# Patient Record
Sex: Female | Born: 1975
Health system: Southern US, Community
[De-identification: ages and names within clinical notes are randomized; demographics above are authoritative.]

## PROBLEM LIST (undated history)

## (undated) DIAGNOSIS — N898 Other specified noninflammatory disorders of vagina: Secondary | ICD-10-CM

## (undated) DIAGNOSIS — D219 Benign neoplasm of connective and other soft tissue, unspecified: Secondary | ICD-10-CM

## (undated) DIAGNOSIS — R319 Hematuria, unspecified: Secondary | ICD-10-CM

## (undated) DIAGNOSIS — B379 Candidiasis, unspecified: Secondary | ICD-10-CM

## (undated) DIAGNOSIS — D649 Anemia, unspecified: Secondary | ICD-10-CM

## (undated) DIAGNOSIS — Z309 Encounter for contraceptive management, unspecified: Secondary | ICD-10-CM

## (undated) DIAGNOSIS — Z8619 Personal history of other infectious and parasitic diseases: Secondary | ICD-10-CM

## (undated) DIAGNOSIS — A630 Anogenital (venereal) warts: Secondary | ICD-10-CM

## (undated) HISTORY — DX: Benign neoplasm of connective and other soft tissue, unspecified: D21.9

## (undated) HISTORY — PX: TOOTH EXTRACTION: SUR596

## (undated) HISTORY — DX: Encounter for contraceptive management, unspecified: Z30.9

## (undated) HISTORY — DX: Hematuria, unspecified: R31.9

## (undated) HISTORY — DX: Anemia, unspecified: D64.9

## (undated) HISTORY — DX: Anogenital (venereal) warts: A63.0

## (undated) HISTORY — DX: Candidiasis, unspecified: B37.9

## (undated) HISTORY — DX: Other specified noninflammatory disorders of vagina: N89.8

## (undated) HISTORY — DX: Personal history of other infectious and parasitic diseases: Z86.19

## (undated) HISTORY — PX: ABDOMINAL HYSTERECTOMY: SHX81

---

## 2009-12-26 ENCOUNTER — Other Ambulatory Visit: Admission: RE | Admit: 2009-12-26 | Discharge: 2009-12-26 | Payer: Self-pay | Admitting: Obstetrics & Gynecology

## 2011-01-03 ENCOUNTER — Other Ambulatory Visit (HOSPITAL_COMMUNITY)
Admission: RE | Admit: 2011-01-03 | Discharge: 2011-01-03 | Disposition: A | Discharge status: Home / Self Care | Payer: BC Managed Care – PPO | Source: Ambulatory Visit | Attending: Obstetrics and Gynecology | Admitting: Obstetrics and Gynecology

## 2011-01-03 DIAGNOSIS — Z01419 Encounter for gynecological examination (general) (routine) without abnormal findings: Secondary | ICD-10-CM | POA: Insufficient documentation

## 2012-01-08 ENCOUNTER — Other Ambulatory Visit (HOSPITAL_COMMUNITY)
Admission: RE | Admit: 2012-01-08 | Discharge: 2012-01-08 | Disposition: A | Discharge status: Home / Self Care | Payer: BC Managed Care – PPO | Source: Ambulatory Visit | Attending: Obstetrics and Gynecology | Admitting: Obstetrics and Gynecology

## 2012-01-08 ENCOUNTER — Other Ambulatory Visit: Payer: Self-pay | Admitting: Obstetrics & Gynecology

## 2012-01-08 DIAGNOSIS — R87810 Cervical high risk human papillomavirus (HPV) DNA test positive: Secondary | ICD-10-CM | POA: Insufficient documentation

## 2012-01-08 DIAGNOSIS — Z01419 Encounter for gynecological examination (general) (routine) without abnormal findings: Secondary | ICD-10-CM | POA: Insufficient documentation

## 2012-01-08 DIAGNOSIS — Z1151 Encounter for screening for human papillomavirus (HPV): Secondary | ICD-10-CM | POA: Insufficient documentation

## 2013-01-05 ENCOUNTER — Other Ambulatory Visit: Payer: Self-pay | Admitting: Adult Health

## 2013-01-11 ENCOUNTER — Other Ambulatory Visit: Payer: Self-pay | Admitting: Adult Health

## 2013-01-28 ENCOUNTER — Encounter (INDEPENDENT_AMBULATORY_CARE_PROVIDER_SITE_OTHER): Payer: Self-pay

## 2013-01-28 ENCOUNTER — Encounter: Payer: Self-pay | Admitting: Adult Health

## 2013-01-28 ENCOUNTER — Ambulatory Visit (INDEPENDENT_AMBULATORY_CARE_PROVIDER_SITE_OTHER): Payer: BC Managed Care – PPO | Admitting: Adult Health

## 2013-01-28 ENCOUNTER — Other Ambulatory Visit (HOSPITAL_COMMUNITY)
Admission: RE | Admit: 2013-01-28 | Discharge: 2013-01-28 | Disposition: A | Discharge status: Home / Self Care | Payer: BC Managed Care – PPO | Source: Ambulatory Visit | Attending: Adult Health | Admitting: Adult Health

## 2013-01-28 VITALS — BP 120/82 | HR 72 | Ht 62.0 in | Wt 133.0 lb

## 2013-01-28 DIAGNOSIS — Z8619 Personal history of other infectious and parasitic diseases: Secondary | ICD-10-CM

## 2013-01-28 DIAGNOSIS — R87810 Cervical high risk human papillomavirus (HPV) DNA test positive: Secondary | ICD-10-CM | POA: Insufficient documentation

## 2013-01-28 DIAGNOSIS — Z01419 Encounter for gynecological examination (general) (routine) without abnormal findings: Secondary | ICD-10-CM

## 2013-01-28 DIAGNOSIS — Z1151 Encounter for screening for human papillomavirus (HPV): Secondary | ICD-10-CM | POA: Insufficient documentation

## 2013-01-28 DIAGNOSIS — Z309 Encounter for contraceptive management, unspecified: Secondary | ICD-10-CM

## 2013-01-28 HISTORY — DX: Personal history of other infectious and parasitic diseases: Z86.19

## 2013-01-28 HISTORY — DX: Encounter for contraceptive management, unspecified: Z30.9

## 2013-01-28 MED ORDER — NORGESTIMATE-ETH ESTRADIOL 0.25-35 MG-MCG PO TABS: ORAL_TABLET | ORAL | Status: DC

## 2013-01-28 NOTE — Patient Instructions (Signed)
Physical in 1 year Mammogram at 40  Continue pills  Follow up prn

## 2013-01-28 NOTE — Progress Notes (Signed)
Patient ID: Nichole Vargas, female   DOB: 15-Nov-1975, 37 y.o.   MRN: [id number] History of Present Illness: Nichole Vargas is a 37 year old black female in for a pap and physical.She had a normal pap last year but had +HPV.   Current Medications, Allergies, Past Medical History, Past Surgical History, Family History and Social History were reviewed in [employer] record.     Review of Systems: Patient denies any headaches, blurred vision, shortness of breath, chest pain, abdominal pain, problems with bowel movements, urination, or intercourse. No joint pain or mood changes.    Physical Exam:BP 120/82  Pulse 72  Ht 5\' 2"  (1.575 m)  Wt 133 lb (60.328 kg)  BMI 24.32 kg/m2  LMP 01/13/2013 General:  Well developed, well nourished, no acute distress Skin:  Warm and dry Neck:  Midline trachea, normal thyroid Lungs; Clear to auscultation bilaterally Breast:  No dominant palpable mass, retraction, or nipple discharge Cardiovascular: Regular rate and rhythm Abdomen:  Soft, non tender, no hepatosplenomegaly Pelvic:  External genitalia is normal in appearance.  The vagina is normal in appearance, has some spotting, pap performed with HPV.The cervix is bulbous.  Uterus is felt to be normal size, shape, and contour.  No  adnexal masses or tenderness noted. Extremities:  No swelling or varicosities noted Psych:  No mood changes, alert and cooperative,seems happy, still works at [employer]   Impression: Yearly gyn exam History of HPV Contraceptive mangement    Plan: Refilled sprintec x 1 year Physical in 1 year Mammogram at 40 Call prn problems

## 2013-02-01 ENCOUNTER — Telehealth: Payer: Self-pay | Admitting: Adult Health

## 2013-02-01 NOTE — Telephone Encounter (Signed)
Pt aware of pap normal but +HPV will repeat next year

## 2013-12-20 ENCOUNTER — Encounter: Payer: Self-pay | Admitting: Adult Health

## 2014-01-10 ENCOUNTER — Other Ambulatory Visit: Payer: Self-pay | Admitting: Adult Health

## 2014-02-01 ENCOUNTER — Other Ambulatory Visit: Payer: BC Managed Care – PPO | Admitting: Adult Health

## 2014-02-04 ENCOUNTER — Other Ambulatory Visit (HOSPITAL_COMMUNITY)
Admission: RE | Admit: 2014-02-04 | Discharge: 2014-02-04 | Disposition: A | Discharge status: Home / Self Care | Payer: BC Managed Care – PPO | Source: Ambulatory Visit | Attending: Adult Health | Admitting: Adult Health

## 2014-02-04 ENCOUNTER — Ambulatory Visit (INDEPENDENT_AMBULATORY_CARE_PROVIDER_SITE_OTHER): Payer: BC Managed Care – PPO | Admitting: Adult Health

## 2014-02-04 ENCOUNTER — Encounter: Payer: Self-pay | Admitting: Adult Health

## 2014-02-04 VITALS — BP 138/50 | HR 84 | Ht 63.0 in | Wt 129.0 lb

## 2014-02-04 DIAGNOSIS — Z01419 Encounter for gynecological examination (general) (routine) without abnormal findings: Secondary | ICD-10-CM | POA: Diagnosis not present

## 2014-02-04 DIAGNOSIS — Z3041 Encounter for surveillance of contraceptive pills: Secondary | ICD-10-CM

## 2014-02-04 DIAGNOSIS — Z1151 Encounter for screening for human papillomavirus (HPV): Secondary | ICD-10-CM | POA: Insufficient documentation

## 2014-02-04 DIAGNOSIS — Z8619 Personal history of other infectious and parasitic diseases: Secondary | ICD-10-CM

## 2014-02-04 MED ORDER — NORGESTIMATE-ETH ESTRADIOL 0.25-35 MG-MCG PO TABS: 1.0000 | ORAL_TABLET | Freq: Every day | ORAL | Status: DC

## 2014-02-04 NOTE — Progress Notes (Signed)
Patient ID: Nichole Vargas, female   DOB: 25-Dec-1975, 38 y.o.   MRN: [id number] History of Present Illness: Nichole Vargas is a 38 year old black female in for pap and physical.She had normal pap with +HPV 01/28/13.She is happy with her OCs.No complaints today.   Current Medications, Allergies, Past Medical History, Past Surgical History, Family History and Social History were reviewed in [employer] record.     Review of Systems: Patient denies any headaches, blurred vision, shortness of breath, chest pain, abdominal pain, problems with bowel movements, urination, or intercourse.  No joint pain or mood swings.   Physical Exam:BP 138/50 mmHg  Pulse 84  Ht 5\' 3"  (1.6 m)  Wt 129 lb (58.514 kg)  BMI 22.86 kg/m2  LMP 02/04/2014 General:  Well developed, well nourished, no acute distress Skin:  Warm and dry Neck:  Midline trachea, normal thyroid Lungs; Clear to auscultation bilaterally Breast:  No dominant palpable mass, retraction, or nipple discharge Cardiovascular: Regular rate and rhythm Abdomen:  Soft, non tender, no hepatosplenomegaly Pelvic:  External genitalia is normal in appearance.  The vagina is normal in appearance, with period like blood. The cervix is bulbous and smooth, pap with HPV performed. Uterus is felt to be normal size, shape, and contour.  No adnexal masses or tenderness noted. Extremities:  No swelling or varicosities noted Psych:  No mood changes,alert and cooperative,seems happy She declines flu shot and labs  Impression: Well woman gyn exam with pap Contraceptive management History of +HPV    Plan: Physical in 1 year Mammogram at 40 Refilled sprintec x 1 year

## 2014-02-04 NOTE — Patient Instructions (Signed)
Physical in  1 year Mammogram at 40 

## 2014-02-07 LAB — CYTOLOGY - PAP

## 2014-12-13 ENCOUNTER — Other Ambulatory Visit: Payer: Self-pay | Admitting: Adult Health

## 2015-01-04 ENCOUNTER — Other Ambulatory Visit: Payer: Self-pay | Admitting: Adult Health

## 2015-02-23 ENCOUNTER — Ambulatory Visit (INDEPENDENT_AMBULATORY_CARE_PROVIDER_SITE_OTHER): Payer: BLUE CROSS/BLUE SHIELD | Admitting: Adult Health

## 2015-02-23 ENCOUNTER — Encounter: Payer: Self-pay | Admitting: Adult Health

## 2015-02-23 VITALS — BP 140/80 | HR 96 | Ht 63.0 in | Wt 124.0 lb

## 2015-02-23 DIAGNOSIS — Z01419 Encounter for gynecological examination (general) (routine) without abnormal findings: Secondary | ICD-10-CM

## 2015-02-23 DIAGNOSIS — B379 Candidiasis, unspecified: Secondary | ICD-10-CM | POA: Diagnosis not present

## 2015-02-23 DIAGNOSIS — N898 Other specified noninflammatory disorders of vagina: Secondary | ICD-10-CM

## 2015-02-23 DIAGNOSIS — Z3041 Encounter for surveillance of contraceptive pills: Secondary | ICD-10-CM

## 2015-02-23 HISTORY — DX: Candidiasis, unspecified: B37.9

## 2015-02-23 HISTORY — DX: Other specified noninflammatory disorders of vagina: N89.8

## 2015-02-23 LAB — POCT WET PREP (WET MOUNT): CLUE CELLS WET PREP WHIFF POC: NEGATIVE

## 2015-02-23 MED ORDER — TERCONAZOLE 0.4 % VA CREA: 1.0000 | TOPICAL_CREAM | Freq: Every day | VAGINAL | Status: DC

## 2015-02-23 MED ORDER — NORGESTIMATE-ETH ESTRADIOL 0.25-35 MG-MCG PO TABS: 1.0000 | ORAL_TABLET | Freq: Every day | ORAL | Status: DC

## 2015-02-23 NOTE — Progress Notes (Signed)
Patient ID: Nichole Vargas, female   DOB: Apr 24, 1975, 40 y.o.   MRN: [id number] History of Present Illness:  Nichole Vargas is a 40 year old black female in for a well woman gyn exam, she had a normal pap with negative HPV 02/04/14.She complains of vaginal itching,took amoxicillin for tooth last month, and had extraction, she also has been eating a lot of ice.She is happy with her pills.  Current Medications, Allergies, Past Medical History, Past Surgical History, Family History and Social History were reviewed in [employer] record.     Review of Systems: Patient denies any headaches, hearing loss, fatigue, blurred vision, shortness of breath, chest pain, abdominal pain, problems with bowel movements, urination, or intercourse. No joint pain or mood swings.See HPI for positives.    Physical Exam:BP 140/80 mmHg  Pulse 96  Ht 5\' 3"  (1.6 m)  Wt 124 lb (56.246 kg)  BMI 21.97 kg/m2  LMP 02/03/2015 General:  Well developed, well nourished, no acute distress Skin:  Warm and dry Neck:  Midline trachea, normal thyroid, good ROM, no lymphadenopathy Lungs; Clear to auscultation bilaterally Breast:  No dominant palpable mass, retraction, or nipple discharge Cardiovascular: Regular rate and rhythm Abdomen:  Soft, non tender, no hepatosplenomegaly Pelvic:  External genitalia is normal in appearance, no lesions.  The vagina is normal in appearance,but has a white clumpy discharge, no odor. Urethra has no lesions or masses. The cervix is bulbous.  Uterus is felt to be normal size, shape, and contour.  No adnexal masses or tenderness noted.Bladder is non tender, no masses felt.Wet prep was + for yeast buds Extremities/musculoskeletal:  No swelling or varicosities noted, no clubbing or cyanosis Psych:  No mood changes, alert and cooperative,seems happy Will check labs today.  Impression: Well woman gyn exam no pap Vaginal itch  Yeast Contraceptive management    Plan: Check  CBC,CMP,TSH and lipids Physical in 1 year, pap 2018 Mammogram at 40  Rx terazol 7 cream use in vagina x 1 wee Review handout on yeast

## 2015-02-23 NOTE — Patient Instructions (Signed)
Mammogram at 40 Physical in 1 year Use terazol  Will talk when labs back Monilial Vaginitis Vaginitis in a soreness, swelling and redness (inflammation) of the vagina and vulva. Monilial vaginitis is not a sexually transmitted infection. CAUSES  Yeast vaginitis is caused by yeast (candida) that is normally found in your vagina. With a yeast infection, the candida has overgrown in number to a point that upsets the chemical balance. SYMPTOMS   White, thick vaginal discharge.  Swelling, itching, redness and irritation of the vagina and possibly the lips of the vagina (vulva).  Burning or painful urination.  Painful intercourse. DIAGNOSIS  Things that may contribute to monilial vaginitis are:  Postmenopausal and virginal states.  Pregnancy.  Infections.  Being tired, sick or stressed, especially if you had monilial vaginitis in the past.  Diabetes. Good control will help lower the chance.  Birth control pills.  Tight fitting garments.  Using bubble bath, feminine sprays, douches or deodorant tampons.  Taking certain medications that kill germs (antibiotics).  Sporadic recurrence can occur if you become ill. TREATMENT  Your caregiver will give you medication.  There are several kinds of anti monilial vaginal creams and suppositories specific for monilial vaginitis. For recurrent yeast infections, use a suppository or cream in the vagina 2 times a week, or as directed.  Anti-monilial or steroid cream for the itching or irritation of the vulva may also be used. Get your caregiver's permission.  Painting the vagina with methylene blue solution may help if the monilial cream does not work.  Eating yogurt may help prevent monilial vaginitis. HOME CARE INSTRUCTIONS   Finish all medication as prescribed.  Do not have sex until treatment is completed or after your caregiver tells you it is okay.  Take warm sitz baths.  Do not douche.  Do not use tampons, especially  scented ones.  Wear cotton underwear.  Avoid tight pants and panty hose.  Tell your sexual partner that you have a yeast infection. They should go to their caregiver if they have symptoms such as mild rash or itching.  Your sexual partner should be treated as well if your infection is difficult to eliminate.  Practice safer sex. Use condoms.  Some vaginal medications cause latex condoms to fail. Vaginal medications that harm condoms are:  Cleocin cream.  Butoconazole (Femstat).  Terconazole (Terazol) vaginal suppository.  Miconazole (Monistat) (may be purchased over the counter). SEEK MEDICAL CARE IF:   You have a temperature by mouth above 102 F (38.9 C).  The infection is getting worse after 2 days of treatment.  The infection is not getting better after 3 days of treatment.  You develop blisters in or around your vagina.  You develop vaginal bleeding, and it is not your menstrual period.  You have pain when you urinate.  You develop intestinal problems.  You have pain with sexual intercourse.   This information is not intended to replace advice given to you by your health care provider. Make sure you discuss any questions you have with your health care provider.   Document Released: 11/14/2004 Document Revised: 04/29/2011 Document Reviewed: 08/08/2014 Elsevier Interactive Patient Education [employer].

## 2015-02-24 ENCOUNTER — Telehealth: Payer: Self-pay | Admitting: Adult Health

## 2015-02-24 LAB — COMPREHENSIVE METABOLIC PANEL
ALBUMIN: 4 g/dL (ref 3.5–5.5)
ALK PHOS: 55 IU/L (ref 39–117)
ALT: 9 IU/L (ref 0–32)
AST: 13 IU/L (ref 0–40)
Albumin/Globulin Ratio: 1.2 (ref 1.1–2.5)
BILIRUBIN TOTAL: 0.2 mg/dL (ref 0.0–1.2)
BUN / CREAT RATIO: 7 — AB (ref 8–20)
BUN: 7 mg/dL (ref 6–20)
CHLORIDE: 103 mmol/L (ref 96–106)
CO2: 20 mmol/L (ref 18–29)
Calcium: 9.1 mg/dL (ref 8.7–10.2)
Creatinine, Ser: 0.97 mg/dL (ref 0.57–1.00)
GFR calc Af Amer: 85 mL/min/{1.73_m2} (ref >59)
GFR calc non Af Amer: 74 mL/min/{1.73_m2} (ref >59)
GLUCOSE: 93 mg/dL (ref 65–99)
Globulin, Total: 3.4 g/dL (ref 1.5–4.5)
Potassium: 3.9 mmol/L (ref 3.5–5.2)
SODIUM: 142 mmol/L (ref 134–144)
Total Protein: 7.4 g/dL (ref 6.0–8.5)

## 2015-02-24 LAB — CBC
HEMOGLOBIN: 7 g/dL — AB (ref 11.1–15.9)
Hematocrit: 25.1 % — ABNORMAL LOW (ref 34.0–46.6)
MCH: 16.3 pg — AB (ref 26.6–33.0)
MCHC: 27.9 g/dL — AB (ref 31.5–35.7)
MCV: 58 fL — ABNORMAL LOW (ref 79–97)
Platelets: 403 10*3/uL — ABNORMAL HIGH (ref 150–379)
RBC: 4.3 x10E6/uL (ref 3.77–5.28)
RDW: 18.9 % — AB (ref 12.3–15.4)
WBC: 6.6 10*3/uL (ref 3.4–10.8)

## 2015-02-24 LAB — LIPID PANEL
Chol/HDL Ratio: 3.3 ratio units (ref 0.0–4.4)
Cholesterol, Total: 169 mg/dL (ref 100–199)
HDL: 51 mg/dL (ref >39)
LDL Calculated: 99 mg/dL (ref 0–99)
Triglycerides: 96 mg/dL (ref 0–149)
VLDL Cholesterol Cal: 19 mg/dL (ref 5–40)

## 2015-02-24 LAB — TSH: TSH: 4.09 u[IU]/mL (ref 0.450–4.500)

## 2015-02-24 NOTE — Telephone Encounter (Signed)
Pt aware of labs and HGB 7 eat red meats and green leafy vegetables and take 2 OTC iron tabs a day and recheck CBC and ferritin level in 2 weeks

## 2015-03-13 ENCOUNTER — Other Ambulatory Visit: Payer: Self-pay | Admitting: *Deleted

## 2015-03-13 DIAGNOSIS — D649 Anemia, unspecified: Secondary | ICD-10-CM

## 2015-03-14 LAB — CBC
HEMATOCRIT: 28.3 % — AB (ref 34.0–46.6)
HEMOGLOBIN: 7.7 g/dL — AB (ref 11.1–15.9)
MCH: 17.3 pg — ABNORMAL LOW (ref 26.6–33.0)
MCHC: 27.2 g/dL — ABNORMAL LOW (ref 31.5–35.7)
MCV: 64 fL — ABNORMAL LOW (ref 79–97)
Platelets: 448 10*3/uL — ABNORMAL HIGH (ref 150–379)
RBC: 4.46 x10E6/uL (ref 3.77–5.28)
RDW: 23.9 % — ABNORMAL HIGH (ref 12.3–15.4)
WBC: 4.9 10*3/uL (ref 3.4–10.8)

## 2015-03-14 LAB — FERRITIN: FERRITIN: 7 ng/mL — AB (ref 15–150)

## 2015-03-15 ENCOUNTER — Telehealth: Payer: Self-pay | Admitting: Adult Health

## 2015-03-15 NOTE — Telephone Encounter (Signed)
Pt aware HGB 7.7 and ferritin 7, will get appt for IV iron, when questioned more about periods, she says may be heavy every other month or so, will keep period calendar with pad and tampon change rate

## 2015-03-20 ENCOUNTER — Encounter (HOSPITAL_COMMUNITY)
Admission: RE | Admit: 2015-03-20 | Discharge: 2015-03-20 | Disposition: A | Discharge status: Home / Self Care | Payer: BLUE CROSS/BLUE SHIELD | Source: Ambulatory Visit | Attending: Adult Health | Admitting: Adult Health

## 2015-03-20 DIAGNOSIS — D649 Anemia, unspecified: Secondary | ICD-10-CM | POA: Insufficient documentation

## 2015-03-20 MED ORDER — SODIUM CHLORIDE 0.9 % IV SOLN
INTRAVENOUS | Status: DC
  Administered 2015-03-20: 250 mL via INTRAVENOUS

## 2015-03-20 MED ORDER — SODIUM CHLORIDE 0.9 % IV SOLN
510.0000 mg | Freq: Once | INTRAVENOUS | Status: AC
  Administered 2015-03-20: 510 mg via INTRAVENOUS
  Filled 2015-03-20: qty 17

## 2015-04-03 ENCOUNTER — Encounter (HOSPITAL_COMMUNITY): Admission: RE | Admit: 2015-04-03 | Payer: BLUE CROSS/BLUE SHIELD | Source: Ambulatory Visit

## 2015-04-03 ENCOUNTER — Encounter (HOSPITAL_COMMUNITY)
Admission: RE | Admit: 2015-04-03 | Discharge: 2015-04-03 | Disposition: A | Discharge status: Home / Self Care | Payer: BLUE CROSS/BLUE SHIELD | Source: Ambulatory Visit | Attending: Adult Health | Admitting: Adult Health

## 2015-04-03 DIAGNOSIS — D649 Anemia, unspecified: Secondary | ICD-10-CM | POA: Diagnosis present

## 2015-04-03 MED ORDER — SODIUM CHLORIDE 0.9 % IV SOLN
510.0000 mg | Freq: Once | INTRAVENOUS | Status: AC
  Administered 2015-04-03: 510 mg via INTRAVENOUS
  Filled 2015-04-03: qty 17

## 2015-04-03 MED ORDER — SODIUM CHLORIDE 0.9 % IV SOLN
INTRAVENOUS | Status: DC
  Administered 2015-04-03: 250 mL via INTRAVENOUS

## 2016-02-26 ENCOUNTER — Ambulatory Visit (INDEPENDENT_AMBULATORY_CARE_PROVIDER_SITE_OTHER): Payer: BLUE CROSS/BLUE SHIELD | Admitting: Adult Health

## 2016-02-26 ENCOUNTER — Encounter: Payer: Self-pay | Admitting: Adult Health

## 2016-02-26 VITALS — BP 131/67 | HR 70 | Ht 62.5 in | Wt 131.0 lb

## 2016-02-26 DIAGNOSIS — N3946 Mixed incontinence: Secondary | ICD-10-CM | POA: Diagnosis not present

## 2016-02-26 DIAGNOSIS — D500 Iron deficiency anemia secondary to blood loss (chronic): Secondary | ICD-10-CM

## 2016-02-26 DIAGNOSIS — Z1212 Encounter for screening for malignant neoplasm of rectum: Secondary | ICD-10-CM

## 2016-02-26 DIAGNOSIS — Z01411 Encounter for gynecological examination (general) (routine) with abnormal findings: Secondary | ICD-10-CM | POA: Diagnosis not present

## 2016-02-26 DIAGNOSIS — R319 Hematuria, unspecified: Secondary | ICD-10-CM

## 2016-02-26 DIAGNOSIS — Z01419 Encounter for gynecological examination (general) (routine) without abnormal findings: Secondary | ICD-10-CM

## 2016-02-26 DIAGNOSIS — Z3041 Encounter for surveillance of contraceptive pills: Secondary | ICD-10-CM

## 2016-02-26 DIAGNOSIS — Z1211 Encounter for screening for malignant neoplasm of colon: Secondary | ICD-10-CM

## 2016-02-26 LAB — POCT URINALYSIS DIPSTICK
GLUCOSE UA: NEGATIVE
KETONES UA: NEGATIVE
LEUKOCYTES UA: NEGATIVE
NITRITE UA: NEGATIVE
Protein, UA: NEGATIVE

## 2016-02-26 LAB — HEMOCCULT GUIAC POC 1CARD (OFFICE): Fecal Occult Blood, POC: NEGATIVE

## 2016-02-26 MED ORDER — NORGESTIMATE-ETH ESTRADIOL 0.25-35 MG-MCG PO TABS: 1.0000 | ORAL_TABLET | Freq: Every day | ORAL | 12 refills | Status: DC

## 2016-02-26 NOTE — Progress Notes (Signed)
Patient ID: Nichole Vargas, female   DOB: 04-19-1975, 41 y.o.   MRN: [id number] History of Present Illness: Nichole Vargas is a 41 year old black female in for well woman gyn exam,had normal pap with negative HPV 02/04/14. Had iron infusion last year, HGB and ferritin was 7 then, and periods had been heavy but good now on OCs.    Current Medications, Allergies, Past Medical History, Past Surgical History, Family History and Social History were reviewed in [employer] record.     Review of Systems: Patient denies any headaches, hearing loss, fatigue, blurred vision, shortness of breath, chest pain, abdominal pain, problems with bowel movements, or intercourse. No joint pain or mood swings. Periods good on OCs, is leaking urine when coughs or when has to go or just anytime, wears pantie liner.   Physical Exam:BP 131/67 (BP Location: Left Arm, Patient Position: Sitting, Cuff Size: Normal)   Pulse 70   Ht 5' 2.5" (1.588 m)   Wt 131 lb (59.4 kg)   LMP 02/09/2016 (Approximate)   BMI 23.58 kg/m Urine 2+blood  General:  Well developed, well nourished, no acute distress Skin:  Warm and dry Neck:  Midline trachea, normal thyroid, good ROM, no lymphadenopathy Lungs; Clear to auscultation bilaterally Breast:  No dominant palpable mass, retraction, or nipple discharge Cardiovascular: Regular rate and rhythm Abdomen:  Soft, non tender, no hepatosplenomegaly Pelvic:  External genitalia is normal in appearance, no lesions.  The vagina is normal in appearance. Urethra has no lesions or masses. The cervix is smooth.  Uterus is felt to be normal size, shape, and contour.  No adnexal masses or tenderness noted.Bladder is non tender, no masses felt. Rectal: Good sphincter tone, no polyps, or hemorrhoids felt.  Hemoccult negative. Extremities/musculoskeletal:  No swelling or varicosities noted, no clubbing or cyanosis Psych:  No mood changes, alert and cooperative,seems happy PHQ 2 score  0.  Impression: 1. Encounter for gynecological examination without abnormal finding   2. Hematuria, unspecified type   3. Encounter for surveillance of contraceptive pills   4. Iron deficiency anemia due to chronic blood loss   5. Mixed stress and urge urinary incontinence   6. Screening for colorectal cancer       Plan: Check CBC and TSH and ferritin level Refilled sprintec take 1 daily with 11 refills Get mammogram now and yearly Pap and physical in 1 year Continue Fe UA C&S sent

## 2016-02-26 NOTE — Patient Instructions (Signed)
Mammogram now Physical and pap in 1 year

## 2016-02-27 LAB — URINALYSIS, ROUTINE W REFLEX MICROSCOPIC
Bilirubin, UA: NEGATIVE
Glucose, UA: NEGATIVE
KETONES UA: NEGATIVE
LEUKOCYTES UA: NEGATIVE
NITRITE UA: NEGATIVE
PH UA: 6.5 (ref 5.0–7.5)
Protein, UA: NEGATIVE
SPEC GRAV UA: 1.016 (ref 1.005–1.030)
Urobilinogen, Ur: 0.2 mg/dL (ref 0.2–1.0)

## 2016-02-27 LAB — CBC
HEMOGLOBIN: 9.8 g/dL — AB (ref 11.1–15.9)
Hematocrit: 34.4 % (ref 34.0–46.6)
MCH: 20.6 pg — AB (ref 26.6–33.0)
MCHC: 28.5 g/dL — ABNORMAL LOW (ref 31.5–35.7)
MCV: 72 fL — ABNORMAL LOW (ref 79–97)
Platelets: 398 10*3/uL — ABNORMAL HIGH (ref 150–379)
RBC: 4.75 x10E6/uL (ref 3.77–5.28)
RDW: 23 % — ABNORMAL HIGH (ref 12.3–15.4)
WBC: 6 10*3/uL (ref 3.4–10.8)

## 2016-02-27 LAB — TSH: TSH: 4.61 u[IU]/mL — ABNORMAL HIGH (ref 0.450–4.500)

## 2016-02-27 LAB — MICROSCOPIC EXAMINATION: CASTS: NONE SEEN /LPF

## 2016-02-27 LAB — FERRITIN: Ferritin: 10 ng/mL — ABNORMAL LOW (ref 15–150)

## 2016-02-28 LAB — URINE CULTURE: Organism ID, Bacteria: NO GROWTH

## 2016-03-01 ENCOUNTER — Other Ambulatory Visit: Payer: BLUE CROSS/BLUE SHIELD | Admitting: Adult Health

## 2016-03-05 ENCOUNTER — Telehealth: Payer: Self-pay | Admitting: Adult Health

## 2016-03-05 NOTE — Telephone Encounter (Signed)
Left message about labs, need to recheck TSH and urine in 4 weeks, TSH was slightly elevated and there e was blood in urine, HGB was better and ferritin up but not WNL yet

## 2016-03-12 ENCOUNTER — Other Ambulatory Visit: Payer: Self-pay | Admitting: Adult Health

## 2016-03-12 DIAGNOSIS — Z1231 Encounter for screening mammogram for malignant neoplasm of breast: Secondary | ICD-10-CM

## 2016-03-18 ENCOUNTER — Telehealth: Payer: Self-pay | Admitting: Adult Health

## 2016-03-18 NOTE — Telephone Encounter (Signed)
Left message to call me about labs

## 2016-03-20 ENCOUNTER — Ambulatory Visit (HOSPITAL_COMMUNITY)
Admission: RE | Admit: 2016-03-20 | Discharge: 2016-03-20 | Disposition: A | Discharge status: Home / Self Care | Payer: BLUE CROSS/BLUE SHIELD | Source: Ambulatory Visit | Attending: Adult Health | Admitting: Adult Health

## 2016-03-20 DIAGNOSIS — Z1231 Encounter for screening mammogram for malignant neoplasm of breast: Secondary | ICD-10-CM | POA: Diagnosis not present

## 2016-03-25 ENCOUNTER — Ambulatory Visit: Payer: BLUE CROSS/BLUE SHIELD

## 2016-03-25 ENCOUNTER — Telehealth: Payer: Self-pay | Admitting: Adult Health

## 2016-03-25 NOTE — Telephone Encounter (Signed)
Pt aware of labs and urine, has appt 2/14 recheck urine and TSH free T4

## 2016-04-03 ENCOUNTER — Encounter: Payer: Self-pay | Admitting: Adult Health

## 2016-04-03 ENCOUNTER — Ambulatory Visit (INDEPENDENT_AMBULATORY_CARE_PROVIDER_SITE_OTHER): Payer: BLUE CROSS/BLUE SHIELD | Admitting: Adult Health

## 2016-04-03 VITALS — BP 132/80 | HR 76 | Ht 62.0 in | Wt 130.5 lb

## 2016-04-03 DIAGNOSIS — R319 Hematuria, unspecified: Secondary | ICD-10-CM | POA: Diagnosis not present

## 2016-04-03 DIAGNOSIS — R7989 Other specified abnormal findings of blood chemistry: Secondary | ICD-10-CM

## 2016-04-03 DIAGNOSIS — R946 Abnormal results of thyroid function studies: Secondary | ICD-10-CM | POA: Diagnosis not present

## 2016-04-03 DIAGNOSIS — D500 Iron deficiency anemia secondary to blood loss (chronic): Secondary | ICD-10-CM | POA: Diagnosis not present

## 2016-04-03 NOTE — Progress Notes (Signed)
Subjective:     Patient ID: Nichole Vargas, female   DOB: 23-Nov-1975, 41 y.o.   MRN: [id number]  HPI Nichole Vargas is a 41 year old black female back in for urine check, had blood in urine and recheck thyroid.   Review of Systems Patient denies any headaches, hearing loss, fatigue, blurred vision, shortness of breath, chest pain, abdominal pain, problems with bowel movements, urination, or intercourse. No joint pain or mood swings. Reviewed past medical,surgical, social and family history. Reviewed medications and allergies.     Objective:   Physical Exam BP 132/80 (BP Location: Left Arm, Patient Position: Sitting, Cuff Size: Normal)   Pulse 76   Ht 5\' 2"  (1.575 m)   Wt 130 lb 8 oz (59.2 kg)   LMP 03/29/2016 (Exact Date)   BMI 23.87 kg/m PHQ 2 score 0. Has period today, will get to recheck urine when period off.will check thyroid today as last TSH was slightly elevated.   She declines iron infusion at this time.  Assessment:        1. Abnormal thyroid blood test   2. Iron deficiency anemia due to chronic blood loss   3. Hematuria, unspecified type    Plan:    Try flintstones with iron 2 daily Gave cup to collect urine when not on period Check TSH and free T4 today Follow up prn

## 2016-04-08 ENCOUNTER — Other Ambulatory Visit: Payer: BLUE CROSS/BLUE SHIELD

## 2016-04-08 ENCOUNTER — Telehealth: Payer: Self-pay | Admitting: Adult Health

## 2016-04-08 DIAGNOSIS — R319 Hematuria, unspecified: Secondary | ICD-10-CM | POA: Diagnosis not present

## 2016-04-08 NOTE — Telephone Encounter (Signed)
Pt aware thyroid labs normal now.

## 2016-04-09 LAB — URINALYSIS, ROUTINE W REFLEX MICROSCOPIC
BILIRUBIN UA: NEGATIVE
GLUCOSE, UA: NEGATIVE
KETONES UA: NEGATIVE
Nitrite, UA: NEGATIVE
PROTEIN UA: NEGATIVE
SPEC GRAV UA: 1.018 (ref 1.005–1.030)
Urobilinogen, Ur: 0.2 mg/dL (ref 0.2–1.0)
pH, UA: 5.5 (ref 5.0–7.5)

## 2016-04-09 LAB — MICROSCOPIC EXAMINATION
Casts: NONE SEEN /lpf
Epithelial Cells (non renal): >10 /hpf — AB (ref 0–10)

## 2016-04-10 LAB — URINE CULTURE: Organism ID, Bacteria: NO GROWTH

## 2016-04-11 ENCOUNTER — Encounter: Payer: Self-pay | Admitting: Adult Health

## 2016-04-11 ENCOUNTER — Telehealth: Payer: Self-pay | Admitting: Adult Health

## 2016-04-11 DIAGNOSIS — R319 Hematuria, unspecified: Secondary | ICD-10-CM

## 2016-04-11 DIAGNOSIS — D508 Other iron deficiency anemias: Secondary | ICD-10-CM

## 2016-04-11 DIAGNOSIS — D649 Anemia, unspecified: Secondary | ICD-10-CM

## 2016-04-11 HISTORY — DX: Hematuria, unspecified: R31.9

## 2016-04-11 HISTORY — DX: Anemia, unspecified: D64.9

## 2016-04-11 NOTE — Telephone Encounter (Signed)
Pt aware urine culture had no growth but urinalysis was +blood, will refer to urology

## 2016-05-03 ENCOUNTER — Other Ambulatory Visit: Payer: Self-pay | Admitting: Adult Health

## 2016-05-03 DIAGNOSIS — Z1231 Encounter for screening mammogram for malignant neoplasm of breast: Secondary | ICD-10-CM

## 2016-05-08 ENCOUNTER — Ambulatory Visit (HOSPITAL_COMMUNITY): Payer: BLUE CROSS/BLUE SHIELD

## 2016-06-21 ENCOUNTER — Ambulatory Visit (INDEPENDENT_AMBULATORY_CARE_PROVIDER_SITE_OTHER): Payer: BLUE CROSS/BLUE SHIELD | Admitting: Urology

## 2016-06-21 ENCOUNTER — Other Ambulatory Visit: Payer: Self-pay | Admitting: Urology

## 2016-06-21 DIAGNOSIS — N3946 Mixed incontinence: Secondary | ICD-10-CM | POA: Diagnosis not present

## 2016-06-21 DIAGNOSIS — R3121 Asymptomatic microscopic hematuria: Secondary | ICD-10-CM

## 2016-07-10 ENCOUNTER — Ambulatory Visit (HOSPITAL_COMMUNITY)
Admission: RE | Admit: 2016-07-10 | Discharge: 2016-07-10 | Disposition: A | Discharge status: Home / Self Care | Payer: BLUE CROSS/BLUE SHIELD | Source: Ambulatory Visit | Attending: Urology | Admitting: Urology

## 2016-07-10 DIAGNOSIS — N854 Malposition of uterus: Secondary | ICD-10-CM | POA: Insufficient documentation

## 2016-07-10 DIAGNOSIS — R3129 Other microscopic hematuria: Secondary | ICD-10-CM | POA: Diagnosis not present

## 2016-07-10 DIAGNOSIS — D250 Submucous leiomyoma of uterus: Secondary | ICD-10-CM | POA: Diagnosis not present

## 2016-07-10 DIAGNOSIS — R3121 Asymptomatic microscopic hematuria: Secondary | ICD-10-CM | POA: Insufficient documentation

## 2016-07-10 MED ORDER — IOPAMIDOL (ISOVUE-300) INJECTION 61%
INTRAVENOUS | Status: AC
  Filled 2016-07-10: qty 150

## 2016-07-10 MED ORDER — IOPAMIDOL (ISOVUE-300) INJECTION 61%
150.0000 mL | Freq: Once | INTRAVENOUS | Status: AC | PRN
  Administered 2016-07-10: 150 mL via INTRAVENOUS

## 2016-07-19 ENCOUNTER — Ambulatory Visit (INDEPENDENT_AMBULATORY_CARE_PROVIDER_SITE_OTHER): Payer: BLUE CROSS/BLUE SHIELD | Admitting: Urology

## 2016-07-19 DIAGNOSIS — R3121 Asymptomatic microscopic hematuria: Secondary | ICD-10-CM | POA: Diagnosis not present

## 2016-07-19 DIAGNOSIS — N3946 Mixed incontinence: Secondary | ICD-10-CM

## 2016-07-19 DIAGNOSIS — D251 Intramural leiomyoma of uterus: Secondary | ICD-10-CM | POA: Diagnosis not present

## 2016-07-24 ENCOUNTER — Encounter: Payer: Self-pay | Admitting: Adult Health

## 2016-07-24 ENCOUNTER — Telehealth: Payer: Self-pay | Admitting: Adult Health

## 2016-07-24 DIAGNOSIS — D250 Submucous leiomyoma of uterus: Secondary | ICD-10-CM | POA: Insufficient documentation

## 2016-07-24 DIAGNOSIS — D259 Leiomyoma of uterus, unspecified: Secondary | ICD-10-CM

## 2016-07-24 DIAGNOSIS — D219 Benign neoplasm of connective and other soft tissue, unspecified: Secondary | ICD-10-CM

## 2016-07-24 HISTORY — DX: Benign neoplasm of connective and other soft tissue, unspecified: D21.9

## 2016-07-24 NOTE — Telephone Encounter (Signed)
Pt aware CT showed fibroid, will get [place] to assess, does have heavy periods, even with OCs.[place] appt scheduled

## 2016-07-31 ENCOUNTER — Other Ambulatory Visit: Payer: Self-pay | Admitting: Adult Health

## 2016-07-31 DIAGNOSIS — N920 Excessive and frequent menstruation with regular cycle: Secondary | ICD-10-CM

## 2016-07-31 DIAGNOSIS — D259 Leiomyoma of uterus, unspecified: Secondary | ICD-10-CM

## 2016-08-01 ENCOUNTER — Ambulatory Visit (INDEPENDENT_AMBULATORY_CARE_PROVIDER_SITE_OTHER): Payer: BLUE CROSS/BLUE SHIELD

## 2016-08-01 DIAGNOSIS — D259 Leiomyoma of uterus, unspecified: Secondary | ICD-10-CM | POA: Diagnosis not present

## 2016-08-01 DIAGNOSIS — N920 Excessive and frequent menstruation with regular cycle: Secondary | ICD-10-CM

## 2016-08-01 NOTE — Progress Notes (Signed)
PELVIC US TA/TV: enlarged heterogeneous anteverted uterus w/mult.fibroids,(#1) post fundal submucosal fibroid 7.5 x 5.6 x 6 cm,(#2) subserosal left mid uterus 4.2 x 5.9 x 5.1 cm,(#3) subserosal anterior fundal fibroid 5.1 x 3.4 x 3.7 cm,normal ovaries bilat,ovaries appear mobile,no free fluid,limited view of endometrium,endometrium appears to be distorted by submucosal fibroid,EEC 3.6 mm

## 2016-08-02 ENCOUNTER — Telehealth: Payer: Self-pay | Admitting: Adult Health

## 2016-08-02 NOTE — Telephone Encounter (Signed)
Pt  Aware that US showed fibroids, needs endometrial biopsy,per Dr Glo Herring, she will call back to schedule and she is aware that hysterectomy is treatment option

## 2016-08-07 ENCOUNTER — Telehealth: Payer: Self-pay | Admitting: Adult Health

## 2016-08-07 NOTE — Telephone Encounter (Signed)
Pt asked if ablation an option and no it is not, so she will make appt to discuss possible hysterectomy

## 2016-08-19 ENCOUNTER — Other Ambulatory Visit: Payer: BLUE CROSS/BLUE SHIELD | Admitting: Obstetrics & Gynecology

## 2016-08-26 ENCOUNTER — Ambulatory Visit (INDEPENDENT_AMBULATORY_CARE_PROVIDER_SITE_OTHER): Payer: BLUE CROSS/BLUE SHIELD | Admitting: Obstetrics & Gynecology

## 2016-08-26 ENCOUNTER — Encounter: Payer: Self-pay | Admitting: Obstetrics & Gynecology

## 2016-08-26 VITALS — BP 100/70 | HR 76 | Wt 134.4 lb

## 2016-08-26 DIAGNOSIS — D259 Leiomyoma of uterus, unspecified: Secondary | ICD-10-CM

## 2016-08-26 DIAGNOSIS — N946 Dysmenorrhea, unspecified: Secondary | ICD-10-CM

## 2016-08-26 DIAGNOSIS — N920 Excessive and frequent menstruation with regular cycle: Secondary | ICD-10-CM

## 2016-08-26 DIAGNOSIS — D500 Iron deficiency anemia secondary to blood loss (chronic): Secondary | ICD-10-CM

## 2016-08-26 NOTE — Progress Notes (Signed)
Preoperative History and Physical  Nichole Vargas is a 41 y.o. [id number] with Patient's last menstrual period was 08/16/2016. admitted for a abdominal supracervical hysterectomy with removal of Fallopian tubes, preserve ovaries.   Patient has an enlarged fibroid uterus her most recent sonogram reveals a uterine volume of 480 cc which is consistent with an approximately 16 week size fibroid uterus with at least 3 large fibroids one of which is significantly distorting her endometrial cavity.  The endometrium itself is Cherlynn Kaiser but there is a submucosal myoma which is probably the source of her heavy periods.  The patient has had heavy periods for years at night she changes her pads every hour and she sleeps in a recliner to avoid messing up her sheets.  She generally bleeds 5 days with cramping but the cramping is on a dramatic part of the problem.  She denies any pain with intercourse.  Patient is chronically anemic with a MCV of 77 and RDW which is elevated consistent with chronic iron deficiency anemia.  Several ferritins of all been low and she is taking supplemental iron.  She is on birth control pills for cycle control but even this is not enough to manage her cycles.  Interestingly she says when she goes to [facility] by her pads she buys a 3 month supply and only last for 1 cycle.  As a result of all these factors she is admitted for a mini laparotomy approach with a supracervical abdominal hysterectomy and removal of fallopian tubes and planned preservation of the ovaries unless there is pathology associated we are not anticipating.  Again her sonogram with the ovaries were normal  PMH:    Past Medical History:  Diagnosis Date  . Anemia 04/11/2016  . Contraceptive management 01/28/2013  . Fibroids 07/24/2016  . Hematuria 04/11/2016  . History of HPV infection 01/28/2013  . HPV (human papilloma virus) anogenital infection   . Vaginal itching 02/23/2015  . Yeast infection 02/23/2015    PSH:     Past  Surgical History:  Procedure Laterality Date  . TOOTH EXTRACTION      POb/GynH:      OB History    Gravida Para Term Preterm AB Living   3 2     1 2    SAB TAB Ectopic Multiple Live Births   1       2      SH:   Social History  Substance Use Topics  . Smoking status: Never Smoker  . Smokeless tobacco: Never Used  . Alcohol use No    FH:    Family History  Problem Relation Age of Onset  . Hypertension Mother      Allergies: No Known Allergies  Medications:       Current Outpatient Prescriptions:  .  norgestimate-ethinyl estradiol (SPRINTEC 28) 0.25-35 MG-MCG tablet, Take 1 tablet by mouth daily., Disp: 28 tablet, Rfl: 12 .  ferrous sulfate 325 (65 FE) MG tablet, Take 27 mg by mouth daily with breakfast. Takes 2 tabs prn, Disp: , Rfl:   Review of Systems:   Review of Systems  Constitutional: Negative for fever, chills, weight loss, malaise/fatigue and diaphoresis.  HENT: Negative for hearing loss, ear pain, nosebleeds, congestion, sore throat, neck pain, tinnitus and ear discharge.   Eyes: Negative for blurred vision, double vision, photophobia, pain, discharge and redness.  Respiratory: Negative for cough, hemoptysis, sputum production, shortness of breath, wheezing and stridor.   Cardiovascular: Negative for chest pain, palpitations, orthopnea, claudication, leg swelling and  PND.  Gastrointestinal: Positive for abdominal pain. Negative for heartburn, nausea, vomiting, diarrhea, constipation, blood in stool and melena.  Genitourinary: Negative for dysuria, urgency, frequency, hematuria and flank pain.  Musculoskeletal: Negative for myalgias, back pain, joint pain and falls.  Skin: Negative for itching and rash.  Neurological: Negative for dizziness, tingling, tremors, sensory change, speech change, focal weakness, seizures, loss of consciousness, weakness and headaches.  Endo/Heme/Allergies: Negative for environmental allergies and polydipsia. Does not bruise/bleed  easily.  Psychiatric/Behavioral: Negative for depression, suicidal ideas, hallucinations, memory loss and substance abuse. The patient is not nervous/anxious and does not have insomnia.      PHYSICAL EXAM:  Blood pressure 100/70, pulse 76, weight 134 lb 6.4 oz (61 kg), last menstrual period 08/16/2016.    Vitals reviewed. Constitutional: She is oriented to person, place, and time. She appears well-developed and well-nourished.  HENT:  Head: Normocephalic and atraumatic.  Right Ear: External ear normal.  Left Ear: External ear normal.  Nose: Nose normal.  Mouth/Throat: Oropharynx is clear and moist.  Eyes: Conjunctivae and EOM are normal. Pupils are equal, round, and reactive to light. Right eye exhibits no discharge. Left eye exhibits no discharge. No scleral icterus.  Neck: Normal range of motion. Neck supple. No tracheal deviation present. No thyromegaly present.  Cardiovascular: Normal rate, regular rhythm, normal heart sounds and intact distal pulses.  Exam reveals no gallop and no friction rub.   No murmur heard. Respiratory: Effort normal and breath sounds normal. No respiratory distress. She has no wheezes. She has no rales. She exhibits no tenderness.  GI: Soft. Bowel sounds are normal. She exhibits no distension and no mass. There is tenderness. There is no rebound and no guarding.  Genitourinary:       Vulva is normal without lesions Vagina is pink moist without discharge Cervix normal in appearance and pap is normal Uterus is 16 weeks size fibroid uterus multiple fibroids Adnexa is negative with normal sized ovaries by sonogram  Musculoskeletal: Normal range of motion. She exhibits no edema and no tenderness.  Neurological: She is alert and oriented to person, place, and time. She has normal reflexes. She displays normal reflexes. No cranial nerve deficit. She exhibits normal muscle tone. Coordination normal.  Skin: Skin is warm and dry. No rash noted. No erythema. No  pallor.  Psychiatric: She has a normal mood and affect. Her behavior is normal. Judgment and thought content normal.    Labs: No results found for this or any previous visit (from the past 336 hour(s)).  EKG: No orders found for this or any previous visit.  Imaging Studies: US Transvaginal Non-ob  Result Date: 08/01/2016 GYNECOLOGIC SONOGRAM Juelle Dickmann is a 41 y.o. [id number] LMP 07/19/2016 she is here for a pelvic sonogram for enlarged uterus w/menorrhagia. Uterus                      11.8 x 8.4 x 8.5 cm, enlarged heterogeneous anteverted uterus w/mult.fibroids Endometrium          3.6 mm, symmetrical, limited view of endometrium,endometrium appears to be distorted by submucosal fibroid Right ovary             3.2 x 3.9 x 3.2 cm, wnl Left ovary                2.4 x 2.8 x 2.5 cm, wnl Fibroids :                 (#1) post fundal submucosal  fibroid 7.5 x 5.6 x 6 cm,(#2) subserosal left mid uterus 4.2 x 5.9 x 5.1 cm,(#3) subserosal anterior fundal fibroid 5.1 x 3.4 x 3.7 cm Technician Comments: PELVIC US TA/TV: enlarged heterogeneous anteverted uterus w/mult.fibroids,(#1) post fundal submucosal fibroid 7.5 x 5.6 x 6 cm,(#2) subserosal left mid uterus 4.2 x 5.9 x 5.1 cm,(#3) subserosal anterior fundal fibroid 5.1 x 3.4 x 3.7 cm,normal ovaries bilat,ovaries appear mobile,no free fluid,limited view of endometrium,endometrium appears to be distorted by submucosal fibroid,EEC 3.6 mm Amber Heide Guile 08/01/2016 1:48 PM Clinical Impression and recommendations: I have reviewed the sonogram results above. Combined with the patient's current clinical course, below are my impressions and any appropriate recommendations for management based on the sonographic findings: 1. Enlarged uterus estimated weight 480 g 2. Normal ovaries bilaterally 3. Endometrial cavity is distorted by submucosal fibroids; recommend discussion of surgical options. Consider endometrial biopsy prior to any surgeries FERGUSON,JOHN V   US Pelvis  Complete  Result Date: 08/01/2016 GYNECOLOGIC SONOGRAM Antionette Luster is a 41 y.o. [id number] LMP 07/19/2016 she is here for a pelvic sonogram for enlarged uterus w/menorrhagia. Uterus                      11.8 x 8.4 x 8.5 cm, enlarged heterogeneous anteverted uterus w/mult.fibroids Endometrium          3.6 mm, symmetrical, limited view of endometrium,endometrium appears to be distorted by submucosal fibroid Right ovary             3.2 x 3.9 x 3.2 cm, wnl Left ovary                2.4 x 2.8 x 2.5 cm, wnl Fibroids :                 (#1) post fundal submucosal fibroid 7.5 x 5.6 x 6 cm,(#2) subserosal left mid uterus 4.2 x 5.9 x 5.1 cm,(#3) subserosal anterior fundal fibroid 5.1 x 3.4 x 3.7 cm Technician Comments: PELVIC US TA/TV: enlarged heterogeneous anteverted uterus w/mult.fibroids,(#1) post fundal submucosal fibroid 7.5 x 5.6 x 6 cm,(#2) subserosal left mid uterus 4.2 x 5.9 x 5.1 cm,(#3) subserosal anterior fundal fibroid 5.1 x 3.4 x 3.7 cm,normal ovaries bilat,ovaries appear mobile,no free fluid,limited view of endometrium,endometrium appears to be distorted by submucosal fibroid,EEC 3.6 mm Amber Heide Guile 08/01/2016 1:48 PM Clinical Impression and recommendations: I have reviewed the sonogram results above. Combined with the patient's current clinical course, below are my impressions and any appropriate recommendations for management based on the sonographic findings: 1. Enlarged uterus estimated weight 480 g 2. Normal ovaries bilaterally 3. Endometrial cavity is distorted by submucosal fibroids; recommend discussion of surgical options. Consider endometrial biopsy prior to any surgeries FERGUSON,JOHN V      Assessment:  Menometrorrhagia Dysmenorrhea Enlarged fibroid uterus, 16 weeks size Anemia, iron deficiency    Patient Active Problem List   Diagnosis Date Noted  . Fibroids, submucosal 07/24/2016  . Anemia 04/11/2016  . Hematuria 04/11/2016  . Vaginal itching 02/23/2015  . Yeast infection  02/23/2015  . Contraceptive management 01/28/2013  . History of HPV infection 01/28/2013    Plan: Abdominal supracervical hysterectomy with removal of both fallopian tubes  Pt understands the risks of surgery including but not limited t  excessive bleeding requiring transfusion or reoperation, post-operative infection requiring prolonged hospitalization or re-hospitalization and antibiotic therapy, and damage to other organs including bladder, bowel, ureters and major vessels.  The patient also understands the alternative treatment options which  were discussed in full.  All questions were answered.  Keegan Ducey H 08/26/2016 2:09 PM     Face to face time:  25 minutes  Greater than 50% of the visit time was spent in counseling and coordination of care with the patient.  The summary and outline of the counseling and care coordination is summarized in the note above.   All questions were answered.   Keeghan Mcintire H 08/26/2016 2:09 PM

## 2016-09-04 NOTE — Patient Instructions (Signed)
Nichole Vargas  09/04/2016     @PREFPERIOPPHARMACY @   Your procedure is scheduled on  09/11/2016  Report to [facility] at  1040  A.M.  Call this number if you have problems the morning of surgery:  [phone]   Remember:  Do not eat food or drink liquids after midnight.  Take these medicines the morning of surgery with A SIP OF WATER   None   Do not wear jewelry, make-up or nail polish.  Do not wear lotions, powders, or perfumes, or deoderant.  Do not shave 48 hours prior to surgery.  Men may shave face and neck.  Do not bring valuables to the hospital.  [facility] is not responsible for any belongings or valuables.  Contacts, dentures or bridgework may not be worn into surgery.  Leave your suitcase in the car.  After surgery it may be brought to your room.  For patients admitted to the hospital, discharge time will be determined by your treatment team.  Patients discharged the day of surgery will not be allowed to drive home.   Name and phone number of your driver:   family Special instructions:  None  Please read over the following fact sheets that you were given. Pain Booklet, Coughing and Deep Breathing, Blood Transfusion Information, Total Joint Packet, MRSA Information, Surgical Site Infection Prevention, Anesthesia Post-op Instructions and Care and Recovery After Surgery      Supracervical Hysterectomy A supracervical hysterectomy is surgery to remove the top part of the uterus, but not the cervix. You will no longer have menstrual periods or be able to get pregnant after this surgery. The fallopian tubes and ovaries may also be removed (bilateral salpingo-oophorectomy) during this surgery. This surgery is usually performed using a minimally invasive technique called laparoscopy. This technique allows the surgery to be done through small incisions. The minimally invasive technique provides benefits such as less pain, less risk of infection, and  shorter recovery time. Tell a health care provider about:  Any allergies you have.  All medicines you are taking, including vitamins, herbs, eye drops, creams, and over-the-counter medicines.  Any problems you or family members have had with anesthetic medicines.  Any blood disorders you have.  Any surgeries you have had.  Any medical conditions you have. What are the risks? Generally, this is a safe procedure. However, as with any procedure, complications can occur. Possible complications include:  Bleeding.  Blood clots in the legs or lung.  Infection.  Injury to surrounding organs.  Problems related to anesthesia.  Conversion to an open abdominal surgery.  Additional surgery later to remove the cervix if you have problems with the cervix.  What happens before the procedure?  Ask your health care provider about changing or stopping your regular medicines.  Do not take aspirin or blood thinners (anticoagulants) for 1 week before the surgery, or as directed by your health care provider.  Do not eat or drink anything for 8 hours before the surgery, or as directed by your health care provider.  Quit smoking if you smoke.  Arrange for a ride home after surgery and for someone to help you at home during recovery. What happens during the procedure?  You will be given an antibiotic medicine.  An IV tube will be placed in one of your veins. You will be given medicine to make you sleep (general anesthetic).  A gas (carbon dioxide) will be used to  inflate your abdomen. This will allow your surgeon to look inside your abdomen, perform your surgery, and treat any other problems found if necessary.  Three or four small incisions will be made in your abdomen. One of these incisions will be made in the area of your belly button (navel). A thin, flexible tube with a tiny camera and light on the end of it (laparoscope) will be inserted into the incision. The camera on the  laparoscope sends a picture to a TV screen in the operating room. This gives your surgeon a good view inside the abdomen.  Other surgical instruments will be inserted through the other incisions.  The uterus will be cut into small pieces and removed through the small incisions.  Your incisions will be closed. What happens after the procedure?  You will be taken to a recovery area where your progress will be monitored until you are awake, stable, and taking fluids well. If there are no other problems, you will then be moved to a regular hospital room, or you will be allowed to go home.  You will likely have minimal discomfort after the surgery because the incisions are so small with the laparoscopic technique.  You will be given pain medicine while you are in the hospital and for when you go home.  If a bilateral salpingo-oophorectomy was performed before menopause, you will go through a sudden (abrupt) menopause. This can be helped with hormone medicines. This information is not intended to replace advice given to you by your health care provider. Make sure you discuss any questions you have with your health care provider. Document Released: 07/24/2007 Document Revised: 07/13/2015 Document Reviewed: 08/07/2012 Elsevier Interactive Patient Education  2018 [place] Hysterectomy, Care After Refer to this sheet in the next few weeks. These instructions provide you with information on caring for yourself after your procedure. Your health care provider may also give you more specific instructions. Your treatment has been planned according to current medical practices, but problems sometimes occur. Call your health care provider if you have any problems or questions after your procedure. What can I expect after the procedure? After your procedure, it is typical to have some discomfort, tenderness, swelling, and bruising at the surgical sites. This normally lasts for about 2  weeks. Follow these instructions at home:  Get plenty of rest and sleep.  Only take over-the-counter or prescription medicines as directed by your health care provider.  Do not take aspirin. It can cause bleeding.  Do not drive until your health care provider approves.  Follow your health care provider's advice regarding exercise, lifting, and general activities.  Resume your usual diet as directed by your health care provider.  Do not douche, use tampons, or have sexual intercourse for at least 6 weeks or until your health care provider gives you permission.  Change your bandages (dressings) only as directed by your health care provider.  Monitor your temperature.  Take showers instead of baths for 2-3 weeks or as directed by your health care provider.  Drink enough fluids to keep your urine clear or pale yellow.  Do not drink alcohol until your health care provider gives you permission.  If you are constipated, you may take a mild laxative if your health care provider approves. Bran foods may also help with constipation problems.  Try to have someone home with you for 1-2 weeks to help with activities.  Follow up with your health care provider as directed. Contact a health care  provider if:  You have swelling, redness, or increasing pain in the incision area.  You have pus coming from an incision.  You notice a bad smell coming from the incision or dressing.  You have swelling, redness, or pain in the area around the IV site.  Your incision breaks open.  You feel dizzy or lightheaded.  You have pain or bleeding when you urinate.  You have persistent diarrhea.  You have persistent nausea and vomiting.  You have abnormal vaginal discharge.  You have a rash.  Your pain is not controlled with your prescribed medicine. Get help right away if:  You have a fever.  You have severe abdominal pain.  You have chest pain.  You have shortness of breath.  You  faint.  You have pain, swelling, or redness in your leg.  You have heavy vaginal bleeding with blood clots. This information is not intended to replace advice given to you by your health care provider. Make sure you discuss any questions you have with your health care provider. Document Released: 11/25/2012 Document Revised: 07/13/2015 Document Reviewed: 08/07/2012 Elsevier Interactive Patient Education  2018 [place], also called tubectomy, is the surgical removal of one of the fallopian tubes. The fallopian tubes are where eggs travel from the ovaries to the uterus. Removing one fallopian tube does not prevent you from becoming pregnant. It also does not cause problems with your menstrual periods. You may need a salpingectomy if you:  Have a fertilized egg that attaches to the fallopian tube (ectopic pregnancy), especially one that causes the tube to burst or tear (rupture).  Have an infected fallopian tube.  Have cancer of the fallopian tube or nearby organs.  Have had an ovary removed due to a cyst or tumor.  Have had your uterus removed.  There are three different methods that can be used for a salpingectomy:  Open. This method involves making one large incision in your abdomen.  Laparoscopic. This method involves using a thin, lighted tube with a tiny camera on the end (laparoscope) to help perform the procedure. The laparoscope will allow your surgeon to make several small incisions in the abdomen instead of a large incision.  Robot-assisted: This method involves using a computer to control surgical instruments that are attached to robotic arms.  Tell a health care provider about:  Any allergies you have.  All medicines you are taking, including vitamins, herbs, eye drops, creams, and over-the-counter medicines.  Any problems you or family members have had with anesthetic medicines.  Any blood disorders you have.  Any surgeries you  have had.  Any medical conditions you have.  Whether you are pregnant or may be pregnant. What are the risks? Generally, this is a safe procedure. However, problems may occur, including:  Infection.  Bleeding.  Allergic reactions to medicines.  Damage to other structures or organs.  Blood clots in the legs or lungs.  What happens before the procedure? Staying hydrated Follow instructions from your health care provider about hydration, which may include:  Up to 2 hours before the procedure - you may continue to drink clear liquids, such as water, clear fruit juice, black coffee, and plain tea.  Eating and drinking restrictions Follow instructions from your health care provider about eating and drinking, which may include:  8 hours before the procedure - stop eating heavy meals or foods such as meat, fried foods, or fatty foods.  6 hours before the procedure - stop eating light  meals or foods, such as toast or cereal.  6 hours before the procedure - stop drinking milk or drinks that contain milk.  2 hours before the procedure - stop drinking clear liquids.  Medicines  Ask your health care provider about: ? Changing or stopping your regular medicines. This is especially important if you are taking diabetes medicines or blood thinners. ? Taking medicines such as aspirin and ibuprofen. These medicines can thin your blood. Do not take these medicines before your procedure if your health care provider instructs you not to.  You may be given antibiotic medicine to help prevent infection. General instructions  Do not smoke for at least 2 weeks before your procedure. If you need help quitting, ask your health care provider.  You may have an exam or tests, such as an electrocardiogram (ECG).  You may have a blood or urine sample taken.  Ask your health care provider: ? Whether you should stop removing hair from your surgical area. ? How your surgical site will be marked or  identified.  You may be asked to shower with a germ-killing soap.  Plan to have someone take you home from the hospital or clinic.  If you will be going home right after the procedure, plan to have someone with you for 24 hours. What happens during the procedure?  To reduce your risk of infection: ? Your health care team will wash or sanitize their hands. ? Hair may be removed from the surgical area. ? Your skin will be washed with soap.  An IV tube will be inserted into one of your veins.  You will be given a medicine to make you fall asleep (general anesthetic). You may also be given a medicine to help you relax (sedative).  A thin tube (catheter) may be inserted through your urethra and into your bladder to drain urine during your procedure.  Depending on the type of procedure you are having, one incision or several small incisions will be made in your abdomen.  Your fallopian tube will be cut and removed from where it attaches to your uterus.  Your blood vessels will be clamped and tied to prevent excess bleeding.  The incision(s) in your abdomen will be closed with stitches (sutures), staples, or skin glue.  A bandage (dressing) may be placed over your incision(s). The procedure may vary among health care providers and hospitals. What happens after the procedure?  Your blood pressure, heart rate, breathing rate, and blood oxygen level will be monitored until the medicines you were given have worn off.  You may continue to receive fluids and medicines through an IV tube.  You may continue to have a catheter draining your urine.  You may have to wear compression stockings. These stockings help to prevent blood clots and reduce swelling in your legs.  You will be given pain medicine as needed.  Do not drive for 24 hours if you received a sedative. Summary  Salpingectomy is a surgical procedure to remove one of the fallopian tubes.  The procedure may be done with an  open incision, with a laparoscope, or with computer-controlled instruments.  Depending on the type of procedure you are having, one incision or several small incisions will be made in your abdomen.  Your blood pressure, heart rate, breathing rate, and blood oxygen level will be monitored until the medicines you were given have worn off.  Plan to have someone take you home from the hospital or clinic. This information is not  intended to replace advice given to you by your health care provider. Make sure you discuss any questions you have with your health care provider. Document Released: 06/23/2008 Document Revised: 09/22/2015 Document Reviewed: 07/29/2012 Elsevier Interactive Patient Education  2018 [place] Anesthesia, Adult General anesthesia is the use of medicines to make a person "go to sleep" (be unconscious) for a medical procedure. General anesthesia is often recommended when a procedure:  Is long.  Requires you to be still or in an unusual position.  Is major and can cause you to lose blood.  Is impossible to do without general anesthesia.  The medicines used for general anesthesia are called general anesthetics. In addition to making you sleep, the medicines:  Prevent pain.  Control your blood pressure.  Relax your muscles.  Tell a health care provider about:  Any allergies you have.  All medicines you are taking, including vitamins, herbs, eye drops, creams, and over-the-counter medicines.  Any problems you or family members have had with anesthetic medicines.  Types of anesthetics you have had in the past.  Any bleeding disorders you have.  Any surgeries you have had.  Any medical conditions you have.  Any history of heart or lung conditions, such as heart failure, sleep apnea, or chronic obstructive pulmonary disease (COPD).  Whether you are pregnant or may be pregnant.  Whether you use tobacco, alcohol, marijuana, or street drugs.  Any  history of Armed forces logistics/support/administrative officer.  Any history of depression or anxiety. What are the risks? Generally, this is a safe procedure. However, problems may occur, including:  Allergic reaction to anesthetics.  Lung and heart problems.  Inhaling food or liquids from your stomach into your lungs (aspiration).  Injury to nerves.  Waking up during your procedure and being unable to move (rare).  Extreme agitation or a state of mental confusion (delirium) when you wake up from the anesthetic.  Air in the bloodstream, which can lead to stroke.  These problems are more likely to develop if you are having a major surgery or if you have an advanced medical condition. You can prevent some of these complications by answering all of your health care provider's questions thoroughly and by following all pre-procedure instructions. General anesthesia can cause side effects, including:  Nausea or vomiting  A sore throat from the breathing tube.  Feeling cold or shivery.  Feeling tired, washed out, or achy.  Sleepiness or drowsiness.  Confusion or agitation.  What happens before the procedure? Staying hydrated Follow instructions from your health care provider about hydration, which may include:  Up to 2 hours before the procedure - you may continue to drink clear liquids, such as water, clear fruit juice, black coffee, and plain tea.  Eating and drinking restrictions Follow instructions from your health care provider about eating and drinking, which may include:  8 hours before the procedure - stop eating heavy meals or foods such as meat, fried foods, or fatty foods.  6 hours before the procedure - stop eating light meals or foods, such as toast or cereal.  6 hours before the procedure - stop drinking milk or drinks that contain milk.  2 hours before the procedure - stop drinking clear liquids.  Medicines  Ask your health care provider about: ? Changing or stopping your regular  medicines. This is especially important if you are taking diabetes medicines or blood thinners. ? Taking medicines such as aspirin and ibuprofen. These medicines can thin your blood. Do not take  these medicines before your procedure if your health care provider instructs you not to. ? Taking new dietary supplements or medicines. Do not take these during the week before your procedure unless your health care provider approves them.  If you are told to take a medicine or to continue taking a medicine on the day of the procedure, take the medicine with sips of water. General instructions   Ask if you will be going home the same day, the following day, or after a longer hospital stay. ? Plan to have someone take you home. ? Plan to have someone stay with you for the first 24 hours after you leave the hospital or clinic.  For 3-6 weeks before the procedure, try not to use any tobacco products, such as cigarettes, chewing tobacco, and e-cigarettes.  You may brush your teeth on the morning of the procedure, but make sure to spit out the toothpaste. What happens during the procedure?  You will be given anesthetics through a mask and through an IV tube in one of your veins.  You may receive medicine to help you relax (sedative).  As soon as you are asleep, a breathing tube may be used to help you breathe.  An anesthesia specialist will stay with you throughout the procedure. He or she will help keep you comfortable and safe by continuing to give you medicines and adjusting the amount of medicine that you get. He or she will also watch your blood pressure, pulse, and oxygen levels to make sure that the anesthetics do not cause any problems.  If a breathing tube was used to help you breathe, it will be removed before you wake up. The procedure may vary among health care providers and hospitals. What happens after the procedure?  You will wake up, often slowly, after the procedure is complete,  usually in a recovery area.  Your blood pressure, heart rate, breathing rate, and blood oxygen level will be monitored until the medicines you were given have worn off.  You may be given medicine to help you calm down if you feel anxious or agitated.  If you will be going home the same day, your health care provider may check to make sure you can stand, drink, and urinate.  Your health care providers will treat your pain and side effects before you go home.  Do not drive for 24 hours if you received a sedative.  You may: ? Feel nauseous and vomit. ? Have a sore throat. ? Have mental slowness. ? Feel cold or shivery. ? Feel sleepy. ? Feel tired. ? Feel sore or achy, even in parts of your body where you did not have surgery. This information is not intended to replace advice given to you by your health care provider. Make sure you discuss any questions you have with your health care provider. Document Released: 05/14/2007 Document Revised: 07/18/2015 Document Reviewed: 01/19/2015 Elsevier Interactive Patient Education  2018 [place] Anesthesia, Adult, Care After These instructions provide you with information about caring for yourself after your procedure. Your health care provider may also give you more specific instructions. Your treatment has been planned according to current medical practices, but problems sometimes occur. Call your health care provider if you have any problems or questions after your procedure. What can I expect after the procedure? After the procedure, it is common to have:  Vomiting.  A sore throat.  Mental slowness.  It is common to feel:  Nauseous.  Cold or shivery.  Sleepy.  Tired.  Sore or achy, even in parts of your body where you did not have surgery.  Follow these instructions at home: For at least 24 hours after the procedure:  Do not: ? Participate in activities where you could fall or become injured. ? Drive. ? Use  heavy machinery. ? Drink alcohol. ? Take sleeping pills or medicines that cause drowsiness. ? Make important decisions or sign legal documents. ? Take care of children on your own.  Rest. Eating and drinking  If you vomit, drink water, juice, or soup when you can drink without vomiting.  Drink enough fluid to keep your urine clear or pale yellow.  Make sure you have little or no nausea before eating solid foods.  Follow the diet recommended by your health care provider. General instructions  Have a responsible adult stay with you until you are awake and alert.  Return to your normal activities as told by your health care provider. Ask your health care provider what activities are safe for you.  Take over-the-counter and prescription medicines only as told by your health care provider.  If you smoke, do not smoke without supervision.  Keep all follow-up visits as told by your health care provider. This is important. Contact a health care provider if:  You continue to have nausea or vomiting at home, and medicines are not helpful.  You cannot drink fluids or start eating again.  You cannot urinate after 8-12 hours.  You develop a skin rash.  You have fever.  You have increasing redness at the site of your procedure. Get help right away if:  You have difficulty breathing.  You have chest pain.  You have unexpected bleeding.  You feel that you are having a life-threatening or urgent problem. This information is not intended to replace advice given to you by your health care provider. Make sure you discuss any questions you have with your health care provider. Document Released: 05/13/2000 Document Revised: 07/10/2015 Document Reviewed: 01/19/2015 Elsevier Interactive Patient Education  [employer].

## 2016-09-06 ENCOUNTER — Encounter (HOSPITAL_COMMUNITY)
Admission: RE | Admit: 2016-09-06 | Discharge: 2016-09-06 | Disposition: A | Discharge status: Home / Self Care | Payer: BLUE CROSS/BLUE SHIELD | Source: Ambulatory Visit | Attending: Obstetrics & Gynecology | Admitting: Obstetrics & Gynecology

## 2016-09-06 ENCOUNTER — Encounter (HOSPITAL_COMMUNITY): Payer: Self-pay

## 2016-09-06 DIAGNOSIS — Z01818 Encounter for other preprocedural examination: Secondary | ICD-10-CM | POA: Insufficient documentation

## 2016-09-06 LAB — RAPID HIV SCREEN (HIV 1/2 AB+AG)
HIV 1/2 Antibodies: NONREACTIVE
HIV-1 P24 Antigen - HIV24: NONREACTIVE

## 2016-09-06 LAB — CBC
HCT: 32.9 % — ABNORMAL LOW (ref 36.0–46.0)
HEMOGLOBIN: 10.5 g/dL — AB (ref 12.0–15.0)
MCH: 24.9 pg — AB (ref 26.0–34.0)
MCHC: 31.9 g/dL (ref 30.0–36.0)
MCV: 78.1 fL (ref 78.0–100.0)
PLATELETS: 331 10*3/uL (ref 150–400)
RBC: 4.21 MIL/uL (ref 3.87–5.11)
RDW: 14.3 % (ref 11.5–15.5)
WBC: 5.8 10*3/uL (ref 4.0–10.5)

## 2016-09-06 LAB — COMPREHENSIVE METABOLIC PANEL
ALK PHOS: 46 U/L (ref 38–126)
ALT: 11 U/L — AB (ref 14–54)
AST: 18 U/L (ref 15–41)
Albumin: 3.5 g/dL (ref 3.5–5.0)
Anion gap: 7 (ref 5–15)
BUN: 10 mg/dL (ref 6–20)
CALCIUM: 8.9 mg/dL (ref 8.9–10.3)
CHLORIDE: 105 mmol/L (ref 101–111)
CO2: 26 mmol/L (ref 22–32)
CREATININE: 1.06 mg/dL — AB (ref 0.44–1.00)
Glucose, Bld: 97 mg/dL (ref 65–99)
Potassium: 3.4 mmol/L — ABNORMAL LOW (ref 3.5–5.1)
Sodium: 138 mmol/L (ref 135–145)
Total Bilirubin: 0.2 mg/dL — ABNORMAL LOW (ref 0.3–1.2)
Total Protein: 7.3 g/dL (ref 6.5–8.1)

## 2016-09-06 LAB — URINALYSIS, ROUTINE W REFLEX MICROSCOPIC
BILIRUBIN URINE: NEGATIVE
GLUCOSE, UA: NEGATIVE mg/dL
Ketones, ur: NEGATIVE mg/dL
NITRITE: NEGATIVE
PH: 6 (ref 5.0–8.0)
Protein, ur: NEGATIVE mg/dL
SPECIFIC GRAVITY, URINE: 1.015 (ref 1.005–1.030)

## 2016-09-06 LAB — SURGICAL PCR SCREEN
MRSA, PCR: NEGATIVE
Staphylococcus aureus: NEGATIVE

## 2016-09-06 LAB — HCG, QUANTITATIVE, PREGNANCY

## 2016-09-07 LAB — TYPE AND SCREEN
ABO/RH(D): O POS
ANTIBODY SCREEN: NEGATIVE

## 2016-09-11 ENCOUNTER — Encounter (HOSPITAL_COMMUNITY)
Admission: RE | Disposition: A | Discharge status: Home / Self Care | Payer: Self-pay | Source: Ambulatory Visit | Attending: Obstetrics & Gynecology

## 2016-09-11 ENCOUNTER — Inpatient Hospital Stay (HOSPITAL_COMMUNITY): Payer: BLUE CROSS/BLUE SHIELD | Admitting: Anesthesiology

## 2016-09-11 ENCOUNTER — Encounter (HOSPITAL_COMMUNITY): Payer: Self-pay | Admitting: *Deleted

## 2016-09-11 ENCOUNTER — Inpatient Hospital Stay (HOSPITAL_COMMUNITY)
Admission: RE | Admit: 2016-09-11 | Discharge: 2016-09-12 | DRG: 743 | Disposition: A | Discharge status: Home / Self Care | Payer: BLUE CROSS/BLUE SHIELD | Source: Ambulatory Visit | Attending: Obstetrics & Gynecology | Admitting: Obstetrics & Gynecology

## 2016-09-11 DIAGNOSIS — D251 Intramural leiomyoma of uterus: Secondary | ICD-10-CM | POA: Diagnosis not present

## 2016-09-11 DIAGNOSIS — Z90711 Acquired absence of uterus with remaining cervical stump: Secondary | ICD-10-CM | POA: Diagnosis present

## 2016-09-11 DIAGNOSIS — N920 Excessive and frequent menstruation with regular cycle: Secondary | ICD-10-CM | POA: Diagnosis not present

## 2016-09-11 DIAGNOSIS — N946 Dysmenorrhea, unspecified: Secondary | ICD-10-CM | POA: Diagnosis present

## 2016-09-11 DIAGNOSIS — D250 Submucous leiomyoma of uterus: Principal | ICD-10-CM | POA: Diagnosis present

## 2016-09-11 DIAGNOSIS — Z79890 Hormone replacement therapy (postmenopausal): Secondary | ICD-10-CM | POA: Diagnosis not present

## 2016-09-11 DIAGNOSIS — N921 Excessive and frequent menstruation with irregular cycle: Secondary | ICD-10-CM | POA: Diagnosis not present

## 2016-09-11 DIAGNOSIS — Z79899 Other long term (current) drug therapy: Secondary | ICD-10-CM

## 2016-09-11 DIAGNOSIS — D259 Leiomyoma of uterus, unspecified: Secondary | ICD-10-CM | POA: Diagnosis not present

## 2016-09-11 DIAGNOSIS — D500 Iron deficiency anemia secondary to blood loss (chronic): Secondary | ICD-10-CM | POA: Diagnosis not present

## 2016-09-11 HISTORY — PX: SUPRACERVICAL ABDOMINAL HYSTERECTOMY: SHX5393

## 2016-09-11 HISTORY — PX: BILATERAL SALPINGECTOMY: SHX5743

## 2016-09-11 SURGERY — HYSTERECTOMY, SUPRACERVICAL, ABDOMINAL
Anesthesia: General | Site: Abdomen

## 2016-09-11 MED ORDER — BUPIVACAINE LIPOSOME 1.3 % IJ SUSP
20.0000 mL | Freq: Once | INTRAMUSCULAR | Status: DC
  Filled 2016-09-11: qty 20

## 2016-09-11 MED ORDER — FENTANYL CITRATE (PF) 100 MCG/2ML IJ SOLN
INTRAMUSCULAR | Status: AC
  Filled 2016-09-11: qty 2

## 2016-09-11 MED ORDER — CEFAZOLIN SODIUM-DEXTROSE 2-4 GM/100ML-% IV SOLN
2.0000 g | INTRAVENOUS | Status: AC
  Administered 2016-09-11: 2 g via INTRAVENOUS

## 2016-09-11 MED ORDER — DOCUSATE SODIUM 100 MG PO CAPS
100.0000 mg | ORAL_CAPSULE | Freq: Two times a day (BID) | ORAL | Status: DC
  Filled 2016-09-11: qty 1

## 2016-09-11 MED ORDER — PROPOFOL 10 MG/ML IV BOLUS
INTRAVENOUS | Status: DC | PRN
  Administered 2016-09-11: 140 mg via INTRAVENOUS

## 2016-09-11 MED ORDER — 0.9 % SODIUM CHLORIDE (POUR BTL) OPTIME
TOPICAL | Status: DC | PRN
  Administered 2016-09-11: 2000 mL

## 2016-09-11 MED ORDER — ONDANSETRON HCL 4 MG PO TABS
8.0000 mg | ORAL_TABLET | Freq: Four times a day (QID) | ORAL | Status: DC | PRN
  Administered 2016-09-11: 8 mg via ORAL
  Filled 2016-09-11: qty 2

## 2016-09-11 MED ORDER — ONDANSETRON HCL 4 MG/2ML IJ SOLN
4.0000 mg | Freq: Once | INTRAMUSCULAR | Status: AC
  Administered 2016-09-11: 4 mg via INTRAVENOUS

## 2016-09-11 MED ORDER — ALUM & MAG HYDROXIDE-SIMETH 200-200-20 MG/5ML PO SUSP: 30.0000 mL | ORAL | Status: DC | PRN

## 2016-09-11 MED ORDER — HYDROMORPHONE HCL 1 MG/ML IJ SOLN: 1.0000 mg | INTRAMUSCULAR | Status: DC | PRN

## 2016-09-11 MED ORDER — ONDANSETRON HCL 4 MG/2ML IJ SOLN
INTRAMUSCULAR | Status: AC
  Filled 2016-09-11: qty 2

## 2016-09-11 MED ORDER — BUPIVACAINE LIPOSOME 1.3 % IJ SUSP
INTRAMUSCULAR | Status: AC
  Filled 2016-09-11: qty 20

## 2016-09-11 MED ORDER — HYDROMORPHONE HCL 1 MG/ML IJ SOLN
0.2500 mg | INTRAMUSCULAR | Status: DC | PRN
  Administered 2016-09-11: 0.5 mg via INTRAVENOUS
  Filled 2016-09-11: qty 1

## 2016-09-11 MED ORDER — GLYCOPYRROLATE 0.2 MG/ML IJ SOLN
INTRAMUSCULAR | Status: DC | PRN
  Administered 2016-09-11: 0.6 mg via INTRAVENOUS

## 2016-09-11 MED ORDER — FENTANYL CITRATE (PF) 250 MCG/5ML IJ SOLN
INTRAMUSCULAR | Status: AC
  Filled 2016-09-11: qty 5

## 2016-09-11 MED ORDER — MIDAZOLAM HCL 2 MG/2ML IJ SOLN
INTRAMUSCULAR | Status: AC
  Filled 2016-09-11: qty 2

## 2016-09-11 MED ORDER — ROCURONIUM BROMIDE 50 MG/5ML IV SOLN
INTRAVENOUS | Status: AC
  Filled 2016-09-11: qty 1

## 2016-09-11 MED ORDER — KETOROLAC TROMETHAMINE 30 MG/ML IJ SOLN
INTRAMUSCULAR | Status: AC
  Filled 2016-09-11: qty 1

## 2016-09-11 MED ORDER — ZOLPIDEM TARTRATE 5 MG PO TABS: 5.0000 mg | ORAL_TABLET | Freq: Every evening | ORAL | Status: DC | PRN

## 2016-09-11 MED ORDER — KETOROLAC TROMETHAMINE 30 MG/ML IJ SOLN
30.0000 mg | Freq: Once | INTRAMUSCULAR | Status: AC
Start: 2016-09-11 — End: 2016-09-11
  Administered 2016-09-11: 30 mg via INTRAVENOUS

## 2016-09-11 MED ORDER — SODIUM CHLORIDE 0.9 % IV SOLN
8.0000 mg | Freq: Four times a day (QID) | INTRAVENOUS | Status: DC | PRN
  Filled 2016-09-11: qty 4

## 2016-09-11 MED ORDER — MIDAZOLAM HCL 2 MG/2ML IJ SOLN
1.0000 mg | INTRAMUSCULAR | Status: DC
  Administered 2016-09-11: 2 mg via INTRAVENOUS

## 2016-09-11 MED ORDER — ROCURONIUM BROMIDE 100 MG/10ML IV SOLN
INTRAVENOUS | Status: DC | PRN
  Administered 2016-09-11: 5 mg via INTRAVENOUS
  Administered 2016-09-11: 35 mg via INTRAVENOUS

## 2016-09-11 MED ORDER — CEFAZOLIN SODIUM-DEXTROSE 2-4 GM/100ML-% IV SOLN
INTRAVENOUS | Status: AC
  Filled 2016-09-11: qty 100

## 2016-09-11 MED ORDER — NEOSTIGMINE METHYLSULFATE 10 MG/10ML IV SOLN
INTRAVENOUS | Status: AC
  Filled 2016-09-11: qty 1

## 2016-09-11 MED ORDER — KCL IN DEXTROSE-NACL 20-5-0.45 MEQ/L-%-% IV SOLN
INTRAVENOUS | Status: DC
  Administered 2016-09-11: 18:00:00 via INTRAVENOUS

## 2016-09-11 MED ORDER — BUPIVACAINE LIPOSOME 1.3 % IJ SUSP
INTRAMUSCULAR | Status: DC | PRN
  Administered 2016-09-11: 20 mL

## 2016-09-11 MED ORDER — FENTANYL CITRATE (PF) 100 MCG/2ML IJ SOLN
INTRAMUSCULAR | Status: DC | PRN
  Administered 2016-09-11: 50 ug via INTRAVENOUS
  Administered 2016-09-11: 100 ug via INTRAVENOUS
  Administered 2016-09-11 (×2): 50 ug via INTRAVENOUS

## 2016-09-11 MED ORDER — LIDOCAINE HCL (PF) 1 % IJ SOLN
INTRAMUSCULAR | Status: AC
  Filled 2016-09-11: qty 5

## 2016-09-11 MED ORDER — BISACODYL 10 MG RE SUPP: 10.0000 mg | Freq: Every day | RECTAL | Status: DC | PRN

## 2016-09-11 MED ORDER — LIDOCAINE HCL 1 % IJ SOLN
INTRAMUSCULAR | Status: DC | PRN
  Administered 2016-09-11: 25 mg via INTRADERMAL

## 2016-09-11 MED ORDER — NEOSTIGMINE METHYLSULFATE 10 MG/10ML IV SOLN
INTRAVENOUS | Status: DC | PRN
  Administered 2016-09-11: 4 mg via INTRAVENOUS

## 2016-09-11 MED ORDER — SENNOSIDES-DOCUSATE SODIUM 8.6-50 MG PO TABS: 1.0000 | ORAL_TABLET | Freq: Every evening | ORAL | Status: DC | PRN

## 2016-09-11 MED ORDER — HEMOSTATIC AGENTS (NO CHARGE) OPTIME
TOPICAL | Status: DC | PRN
  Administered 2016-09-11: 1 via TOPICAL

## 2016-09-11 MED ORDER — OXYCODONE-ACETAMINOPHEN 5-325 MG PO TABS: 1.0000 | ORAL_TABLET | ORAL | Status: DC | PRN

## 2016-09-11 MED ORDER — LACTATED RINGERS IV SOLN
INTRAVENOUS | Status: DC
  Administered 2016-09-11: 1000 mL via INTRAVENOUS
  Administered 2016-09-11: 14:00:00 via INTRAVENOUS

## 2016-09-11 MED ORDER — GLYCOPYRROLATE 0.2 MG/ML IJ SOLN
INTRAMUSCULAR | Status: AC
  Filled 2016-09-11: qty 3

## 2016-09-11 MED ORDER — MIDAZOLAM HCL 5 MG/5ML IJ SOLN
INTRAMUSCULAR | Status: DC | PRN
  Administered 2016-09-11: 2 mg via INTRAVENOUS

## 2016-09-11 SURGICAL SUPPLY — 51 items
BAG HAMPER (MISCELLANEOUS) ×3 IMPLANT
BLADE SURG SZ10 CARB STEEL (BLADE) ×3 IMPLANT
CELLS DAT CNTRL 66122 CELL SVR (MISCELLANEOUS) ×2 IMPLANT
CLOTH BEACON ORANGE TIMEOUT ST (SAFETY) ×3 IMPLANT
COVER LIGHT HANDLE STERIS (MISCELLANEOUS) ×6 IMPLANT
DERMABOND ADVANCED (GAUZE/BANDAGES/DRESSINGS) ×1
DERMABOND ADVANCED .7 DNX12 (GAUZE/BANDAGES/DRESSINGS) ×2 IMPLANT
DRAPE WARM FLUID 44X44 (DRAPE) ×3 IMPLANT
DRSG OPSITE POSTOP 4X8 (GAUZE/BANDAGES/DRESSINGS) ×3 IMPLANT
ELECT REM PT RETURN 9FT ADLT (ELECTROSURGICAL) ×3
ELECTRODE REM PT RTRN 9FT ADLT (ELECTROSURGICAL) ×2 IMPLANT
FORMALIN 10 PREFIL 480ML (MISCELLANEOUS) ×3 IMPLANT
GAUZE SPONGE 4X4 12PLY STRL (GAUZE/BANDAGES/DRESSINGS) ×3 IMPLANT
GAUZE SPONGE 4X4 12PLY STRL LF (GAUZE/BANDAGES/DRESSINGS) ×3 IMPLANT
GLOVE BIOGEL PI IND STRL 7.0 (GLOVE) ×6 IMPLANT
GLOVE BIOGEL PI IND STRL 8 (GLOVE) ×2 IMPLANT
GLOVE BIOGEL PI INDICATOR 7.0 (GLOVE) ×3
GLOVE BIOGEL PI INDICATOR 8 (GLOVE) ×1
GLOVE ECLIPSE 8.0 STRL XLNG CF (GLOVE) ×3 IMPLANT
GOWN STRL REUS W/ TWL LRG LVL3 (GOWN DISPOSABLE) ×4 IMPLANT
GOWN STRL REUS W/TWL LRG LVL3 (GOWN DISPOSABLE) ×2
GOWN STRL REUS W/TWL XL LVL3 (GOWN DISPOSABLE) ×3 IMPLANT
HEMOSTAT ARISTA ABSORB 3G PWDR (MISCELLANEOUS) ×3 IMPLANT
INST SET MAJOR GENERAL (KITS) ×3 IMPLANT
KIT ROOM TURNOVER APOR (KITS) ×3 IMPLANT
MANIFOLD NEPTUNE II (INSTRUMENTS) ×3 IMPLANT
NEEDLE HYPO 21X1.5 SAFETY (NEEDLE) ×3 IMPLANT
NS IRRIG 1000ML POUR BTL (IV SOLUTION) ×6 IMPLANT
PACK ABDOMINAL MAJOR (CUSTOM PROCEDURE TRAY) ×3 IMPLANT
PAD ABD 5X9 TENDERSORB (GAUZE/BANDAGES/DRESSINGS) ×6 IMPLANT
PAD ARMBOARD 7.5X6 YLW CONV (MISCELLANEOUS) ×3 IMPLANT
RETRACTOR WND ALEXIS 25 LRG (MISCELLANEOUS) IMPLANT
RTRCTR WOUND ALEXIS 18CM MED (MISCELLANEOUS) ×3
RTRCTR WOUND ALEXIS 25CM LRG (MISCELLANEOUS)
SET BASIN LINEN APH (SET/KITS/TRAYS/PACK) ×3 IMPLANT
SPONGE LAP 18X18 X RAY DECT (DISPOSABLE) ×3 IMPLANT
SUT CHROMIC 0 CT 1 (SUTURE) ×3 IMPLANT
SUT MNCRL+ AB 3-0 CT1 36 (SUTURE) IMPLANT
SUT MON AB 3-0 SH 27 (SUTURE) IMPLANT
SUT MONOCRYL AB 3-0 CT1 36IN (SUTURE)
SUT PLAIN 2 0 XLH (SUTURE) IMPLANT
SUT VIC AB 0 CT1 27 (SUTURE) ×3
SUT VIC AB 0 CT1 27XBRD ANTBC (SUTURE) ×2 IMPLANT
SUT VIC AB 0 CT1 27XCR 8 STRN (SUTURE) ×4 IMPLANT
SUT VIC AB 0 CTX 36 (SUTURE) ×1
SUT VIC AB 0 CTX36XBRD ANTBCTR (SUTURE) ×2 IMPLANT
SUT VICRYL 3 0 (SUTURE) IMPLANT
SUT VICRYL 4 0 KS 27 (SUTURE) ×3 IMPLANT
SYR 20CC LL (SYRINGE) ×3 IMPLANT
TOWEL BLUE STERILE X RAY DET (MISCELLANEOUS) ×3 IMPLANT
TRAY FOLEY W/METER SILVER 16FR (SET/KITS/TRAYS/PACK) ×3 IMPLANT

## 2016-09-11 NOTE — Transfer of Care (Signed)
Immediate Anesthesia Transfer of Care Note  Patient: Nichole Vargas  Procedure(s) Performed: Procedure(s): HYSTERECTOMY SUPRACERVICAL ABDOMINAL (N/A) BILATERAL SALPINGECTOMY (Bilateral)  Patient Location: PACU  Anesthesia Type:General  Level of Consciousness: sedated and patient cooperative  Airway & Oxygen Therapy: Patient Spontanous Breathing and non-rebreather face mask  Post-op Assessment: Report given to RN and Post -op Vital signs reviewed and stable  Post vital signs: Reviewed and stable  Last Vitals:  Vitals:   09/11/16 1230 09/11/16 1300  BP: 133/80 118/75  Pulse:    Resp: (!) 23 20  Temp:      Last Pain:  Vitals:   09/11/16 1103  TempSrc: Oral      Patients Stated Pain Goal: 9 (16/07/37 1062)  Complications: No apparent anesthesia complications

## 2016-09-11 NOTE — Anesthesia Preprocedure Evaluation (Signed)
Anesthesia Evaluation  Patient identified by MRN, date of birth, ID band Patient awake    Reviewed: Allergy & Precautions, NPO status , Patient's Chart, lab work & pertinent test results  Airway Mallampati: II  TM Distance: >3 FB Neck ROM: Full    Dental  (+) Teeth Intact   Pulmonary neg pulmonary ROS,    breath sounds clear to auscultation       Cardiovascular negative cardio ROS   Rhythm:Regular Rate:Normal     Neuro/Psych negative neurological ROS  negative psych ROS   GI/Hepatic negative GI ROS, Neg liver ROS,   Endo/Other  negative endocrine ROS  Renal/GU negative Renal ROS     Musculoskeletal   Abdominal   Peds  Hematology  (+) anemia ,   Anesthesia Other Findings   Reproductive/Obstetrics                             Anesthesia Physical Anesthesia Plan  ASA: II  Anesthesia Plan: General   Post-op Pain Management:    Induction: Intravenous  PONV Risk Score and Plan:   Airway Management Planned: Oral ETT  Additional Equipment:   Intra-op Plan:   Post-operative Plan: Extubation in OR  Informed Consent: I have reviewed the patients History and Physical, chart, labs and discussed the procedure including the risks, benefits and alternatives for the proposed anesthesia with the patient or authorized representative who has indicated his/her understanding and acceptance.     Plan Discussed with:   Anesthesia Plan Comments:         Anesthesia Quick Evaluation

## 2016-09-11 NOTE — H&P (Signed)
[] Hide copied text [] Hover for attribution information Preoperative History and Physical  Nichole Vargas is a 41 y.o. [id number] with Patient's last menstrual period was 08/16/2016. admitted for a abdominal supracervical hysterectomy with removal of Fallopian tubes, preserve ovaries.   Patient has an enlarged fibroid uterus her most recent sonogram reveals a uterine volume of 480 cc which is consistent with an approximately 16 week size fibroid uterus with at least 3 large fibroids one of which is significantly distorting her endometrial cavity.  The endometrium itself is Cherlynn Kaiser but there is a submucosal myoma which is probably the source of her heavy periods.  The patient has had heavy periods for years at night she changes her pads every hour and she sleeps in a recliner to avoid messing up her sheets.  She generally bleeds 5 days with cramping but the cramping is on a dramatic part of the problem.  She denies any pain with intercourse.  Patient is chronically anemic with a MCV of 77 and RDW which is elevated consistent with chronic iron deficiency anemia.  Several ferritins of all been low and she is taking supplemental iron.  She is on birth control pills for cycle control but even this is not enough to manage her cycles.  Interestingly she says when she goes to [facility] by her pads she buys a 3 month supply and only last for 1 cycle.  As a result of all these factors she is admitted for a mini laparotomy approach with a supracervical abdominal hysterectomy and removal of fallopian tubes and planned preservation of the ovaries unless there is pathology associated we are not anticipating.  Again her sonogram with the ovaries were normal  PMH:        Past Medical History:  Diagnosis Date  . Anemia 04/11/2016  . Contraceptive management 01/28/2013  . Fibroids 07/24/2016  . Hematuria 04/11/2016  . History of HPV infection 01/28/2013  . HPV (human papilloma virus) anogenital infection   . Vaginal  itching 02/23/2015  . Yeast infection 02/23/2015    PSH:          Past Surgical History:  Procedure Laterality Date  . TOOTH EXTRACTION      POb/GynH:              OB History    Gravida Para Term Preterm AB Living   3 2     1 2    SAB TAB Ectopic Multiple Live Births   1       2      SH:       Social History  Substance Use Topics  . Smoking status: Never Smoker  . Smokeless tobacco: Never Used  . Alcohol use No    FH:         Family History  Problem Relation Age of Onset  . Hypertension Mother      Allergies: No Known Allergies  Medications:       Current Outpatient Prescriptions:  .  norgestimate-ethinyl estradiol (SPRINTEC 28) 0.25-35 MG-MCG tablet, Take 1 tablet by mouth daily., Disp: 28 tablet, Rfl: 12 .  ferrous sulfate 325 (65 FE) MG tablet, Take 27 mg by mouth daily with breakfast. Takes 2 tabs prn, Disp: , Rfl:   Review of Systems:   Review of Systems  Constitutional: Negative for fever, chills, weight loss, malaise/fatigue and diaphoresis.  HENT: Negative for hearing loss, ear pain, nosebleeds, congestion, sore throat, neck pain, tinnitus and ear discharge.   Eyes: Negative for blurred vision, double  vision, photophobia, pain, discharge and redness.  Respiratory: Negative for cough, hemoptysis, sputum production, shortness of breath, wheezing and stridor.   Cardiovascular: Negative for chest pain, palpitations, orthopnea, claudication, leg swelling and PND.  Gastrointestinal: Positive for abdominal pain. Negative for heartburn, nausea, vomiting, diarrhea, constipation, blood in stool and melena.  Genitourinary: Negative for dysuria, urgency, frequency, hematuria and flank pain.  Musculoskeletal: Negative for myalgias, back pain, joint pain and falls.  Skin: Negative for itching and rash.  Neurological: Negative for dizziness, tingling, tremors, sensory change, speech change, focal weakness, seizures, loss of consciousness, weakness  and headaches.  Endo/Heme/Allergies: Negative for environmental allergies and polydipsia. Does not bruise/bleed easily.  Psychiatric/Behavioral: Negative for depression, suicidal ideas, hallucinations, memory loss and substance abuse. The patient is not nervous/anxious and does not have insomnia.      PHYSICAL EXAM:  Blood pressure 100/70, pulse 76, weight 134 lb 6.4 oz (61 kg), last menstrual period 08/16/2016.    Vitals reviewed. Constitutional: She is oriented to person, place, and time. She appears well-developed and well-nourished.  HENT:  Head: Normocephalic and atraumatic.  Right Ear: External ear normal.  Left Ear: External ear normal.  Nose: Nose normal.  Mouth/Throat: Oropharynx is clear and moist.  Eyes: Conjunctivae and EOM are normal. Pupils are equal, round, and reactive to light. Right eye exhibits no discharge. Left eye exhibits no discharge. No scleral icterus.  Neck: Normal range of motion. Neck supple. No tracheal deviation present. No thyromegaly present.  Cardiovascular: Normal rate, regular rhythm, normal heart sounds and intact distal pulses.  Exam reveals no gallop and no friction rub.   No murmur heard. Respiratory: Effort normal and breath sounds normal. No respiratory distress. She has no wheezes. She has no rales. She exhibits no tenderness.  GI: Soft. Bowel sounds are normal. She exhibits no distension and no mass. There is tenderness. There is no rebound and no guarding.  Genitourinary:       Vulva is normal without lesions Vagina is pink moist without discharge Cervix normal in appearance and pap is normal Uterus is 16 weeks size fibroid uterus multiple fibroids Adnexa is negative with normal sized ovaries by sonogram  Musculoskeletal: Normal range of motion. She exhibits no edema and no tenderness.  Neurological: She is alert and oriented to person, place, and time. She has normal reflexes. She displays normal reflexes. No cranial nerve deficit.  She exhibits normal muscle tone. Coordination normal.  Skin: Skin is warm and dry. No rash noted. No erythema. No pallor.  Psychiatric: She has a normal mood and affect. Her behavior is normal. Judgment and thought content normal.    Labs: Results for orders placed or performed during the hospital encounter of 09/06/16 (from the past 168 hour(s))  Surgical pcr screen   Collection Time: 09/06/16  2:53 PM  Result Value Ref Range   MRSA, PCR NEGATIVE NEGATIVE   Staphylococcus aureus NEGATIVE NEGATIVE  CBC   Collection Time: 09/06/16  2:53 PM  Result Value Ref Range   WBC 5.8 4.0 - 10.5 K/uL   RBC 4.21 3.87 - 5.11 MIL/uL   Hemoglobin 10.5 (L) 12.0 - 15.0 g/dL   HCT 32.9 (L) 36.0 - 46.0 %   MCV 78.1 78.0 - 100.0 fL   MCH 24.9 (L) 26.0 - 34.0 pg   MCHC 31.9 30.0 - 36.0 g/dL   RDW 14.3 11.5 - 15.5 %   Platelets 331 150 - 400 K/uL  Comprehensive metabolic panel   Collection Time: 09/06/16  2:53 PM  Result Value Ref Range   Sodium 138 135 - 145 mmol/L   Potassium 3.4 (L) 3.5 - 5.1 mmol/L   Chloride 105 101 - 111 mmol/L   CO2 26 22 - 32 mmol/L   Glucose, Bld 97 65 - 99 mg/dL   BUN 10 6 - 20 mg/dL   Creatinine, Ser 1.06 (H) 0.44 - 1.00 mg/dL   Calcium 8.9 8.9 - 10.3 mg/dL   Total Protein 7.3 6.5 - 8.1 g/dL   Albumin 3.5 3.5 - 5.0 g/dL   AST 18 15 - 41 U/L   ALT 11 (L) 14 - 54 U/L   Alkaline Phosphatase 46 38 - 126 U/L   Total Bilirubin 0.2 (L) 0.3 - 1.2 mg/dL   GFR calc non Af Amer >60 >60 mL/min   GFR calc Af Amer >60 >60 mL/min   Anion gap 7 5 - 15  hCG, quantitative, pregnancy   Collection Time: 09/06/16  2:53 PM  Result Value Ref Range   hCG, Beta Chain, Quant, S <1 <5 mIU/mL  Rapid HIV screen (HIV 1/2 Ab+Ag)   Collection Time: 09/06/16  2:53 PM  Result Value Ref Range   HIV-1 P24 Antigen - HIV24 NON REACTIVE NON REACTIVE   HIV 1/2 Antibodies NON REACTIVE NON REACTIVE   Interpretation (HIV Ag Ab)      A non reactive test result means that HIV 1 or HIV 2 antibodies and  HIV 1 p24 antigen were not detected in the specimen.  Urinalysis, Routine w reflex microscopic   Collection Time: 09/06/16  2:53 PM  Result Value Ref Range   Color, Urine YELLOW YELLOW   APPearance HAZY (A) CLEAR   Specific Gravity, Urine 1.015 1.005 - 1.030   pH 6.0 5.0 - 8.0   Glucose, UA NEGATIVE NEGATIVE mg/dL   Hgb urine dipstick LARGE (A) NEGATIVE   Bilirubin Urine NEGATIVE NEGATIVE   Ketones, ur NEGATIVE NEGATIVE mg/dL   Protein, ur NEGATIVE NEGATIVE mg/dL   Nitrite NEGATIVE NEGATIVE   Leukocytes, UA LARGE (A) NEGATIVE   RBC / HPF 0-5 0 - 5 RBC/hpf   WBC, UA 0-5 0 - 5 WBC/hpf   Bacteria, UA RARE (A) NONE SEEN   Squamous Epithelial / LPF 0-5 (A) NONE SEEN   Mucous PRESENT   Type and screen   Collection Time: 09/06/16  2:53 PM  Result Value Ref Range   ABO/RH(D) O POS    Antibody Screen NEG    Sample Expiration 09/20/2016    Extend sample reason NO TRANSFUSIONS OR PREGNANCY IN THE PAST 3 MONTHS      EKG: No orders found for this or any previous visit.  Imaging Studies:  Imaging Results  US Transvaginal Non-ob  Result Date: 08/01/2016 GYNECOLOGIC SONOGRAM Jaleigh Mccroskey is a 41 y.o. [id number] LMP 07/19/2016 she is here for a pelvic sonogram for enlarged uterus w/menorrhagia. Uterus                      11.8 x 8.4 x 8.5 cm, enlarged heterogeneous anteverted uterus w/mult.fibroids Endometrium          3.6 mm, symmetrical, limited view of endometrium,endometrium appears to be distorted by submucosal fibroid Right ovary             3.2 x 3.9 x 3.2 cm, wnl Left ovary                2.4 x 2.8 x 2.5 cm, wnl Fibroids :                 (#  1) post fundal submucosal fibroid 7.5 x 5.6 x 6 cm,(#2) subserosal left mid uterus 4.2 x 5.9 x 5.1 cm,(#3) subserosal anterior fundal fibroid 5.1 x 3.4 x 3.7 cm Technician Comments: PELVIC US TA/TV: enlarged heterogeneous anteverted uterus w/mult.fibroids,(#1) post fundal submucosal fibroid 7.5 x 5.6 x 6 cm,(#2) subserosal left mid uterus 4.2 x 5.9 x  5.1 cm,(#3) subserosal anterior fundal fibroid 5.1 x 3.4 x 3.7 cm,normal ovaries bilat,ovaries appear mobile,no free fluid,limited view of endometrium,endometrium appears to be distorted by submucosal fibroid,EEC 3.6 mm Amber Heide Guile 08/01/2016 1:48 PM Clinical Impression and recommendations: I have reviewed the sonogram results above. Combined with the patient's current clinical course, below are my impressions and any appropriate recommendations for management based on the sonographic findings: 1. Enlarged uterus estimated weight 480 g 2. Normal ovaries bilaterally 3. Endometrial cavity is distorted by submucosal fibroids; recommend discussion of surgical options. Consider endometrial biopsy prior to any surgeries FERGUSON,JOHN V   US Pelvis Complete  Result Date: 08/01/2016 GYNECOLOGIC SONOGRAM Nikyah Lackman is a 41 y.o. [id number] LMP 07/19/2016 she is here for a pelvic sonogram for enlarged uterus w/menorrhagia. Uterus                      11.8 x 8.4 x 8.5 cm, enlarged heterogeneous anteverted uterus w/mult.fibroids Endometrium          3.6 mm, symmetrical, limited view of endometrium,endometrium appears to be distorted by submucosal fibroid Right ovary             3.2 x 3.9 x 3.2 cm, wnl Left ovary                2.4 x 2.8 x 2.5 cm, wnl Fibroids :                 (#1) post fundal submucosal fibroid 7.5 x 5.6 x 6 cm,(#2) subserosal left mid uterus 4.2 x 5.9 x 5.1 cm,(#3) subserosal anterior fundal fibroid 5.1 x 3.4 x 3.7 cm Technician Comments: PELVIC US TA/TV: enlarged heterogeneous anteverted uterus w/mult.fibroids,(#1) post fundal submucosal fibroid 7.5 x 5.6 x 6 cm,(#2) subserosal left mid uterus 4.2 x 5.9 x 5.1 cm,(#3) subserosal anterior fundal fibroid 5.1 x 3.4 x 3.7 cm,normal ovaries bilat,ovaries appear mobile,no free fluid,limited view of endometrium,endometrium appears to be distorted by submucosal fibroid,EEC 3.6 mm Amber Heide Guile 08/01/2016 1:48 PM Clinical Impression and recommendations: I have  reviewed the sonogram results above. Combined with the patient's current clinical course, below are my impressions and any appropriate recommendations for management based on the sonographic findings: 1. Enlarged uterus estimated weight 480 g 2. Normal ovaries bilaterally 3. Endometrial cavity is distorted by submucosal fibroids; recommend discussion of surgical options. Consider endometrial biopsy prior to any surgeries FERGUSON,JOHN V       Assessment:  Menometrorrhagia Dysmenorrhea Enlarged fibroid uterus, 16 weeks size Anemia, iron deficiency        Patient Active Problem List   Diagnosis Date Noted  . Fibroids, submucosal 07/24/2016  . Anemia 04/11/2016  . Hematuria 04/11/2016  . Vaginal itching 02/23/2015  . Yeast infection 02/23/2015  . Contraceptive management 01/28/2013  . History of HPV infection 01/28/2013    Plan: Abdominal supracervical hysterectomy with removal of both fallopian tubes  Pt understands the risks of surgery including but not limited t  excessive bleeding requiring transfusion or reoperation, post-operative infection requiring prolonged hospitalization or re-hospitalization and antibiotic therapy, and damage to other organs including bladder, bowel, ureters and major vessels.  The patient also understands the alternative treatment options which were discussed in full.  All questions were answered.  Genecis Veley H 08/26/2016 2:09 PM

## 2016-09-11 NOTE — Anesthesia Procedure Notes (Signed)
Procedure Name: Intubation Date/Time: 09/11/2016 1:25 PM Performed by: Charmaine Downs Pre-anesthesia Checklist: Patient identified, Patient being monitored, Timeout performed, Emergency Drugs available and Suction available Patient Re-evaluated:Patient Re-evaluated prior to induction Oxygen Delivery Method: Circle System Utilized Preoxygenation: Pre-oxygenation with 100% oxygen Induction Type: IV induction Ventilation: Mask ventilation without difficulty Laryngoscope Size: Mac and 4 Grade View: Grade II Tube type: Oral Tube size: 7.0 mm Number of attempts: 1 Airway Equipment and Method: stylet Placement Confirmation: ETT inserted through vocal cords under direct vision,  positive ETCO2 and breath sounds checked- equal and bilateral Secured at: 22 cm Tube secured with: Tape Dental Injury: Teeth and Oropharynx as per pre-operative assessment

## 2016-09-11 NOTE — Op Note (Signed)
Preoperative diagnosis:  1.  menometrorrhagia                                          2.  dysmenorrhea                                         3.  16 weeks size fibroid uterus                                         4.  Anemia, iron deficiency due to chronic blood loss  Postoperative diagnosis:  Same as above   Procedure:  Abdominal hysterectomy, supracervical with removal of both Fallopian tubes  Surgeon:  Florian Buff  Assistant:    Anesthesia:  General endotracheal  Preoperative clinical summary:   Patient has an enlarged fibroid uterus her most recent sonogram reveals a uterine volume of 480 cc which is consistent with an approximately 16 week size fibroid uterus with at least 3 large fibroids one of which is significantly distorting her endometrial cavity. The endometrium itself is Cherlynn Kaiser but there is a submucosal myoma which is probably the source of her heavy periods.The patient has had heavy periods for years at night she changes her pads every hour and she sleeps in a recliner to avoid messing up her sheets.She generally bleeds 5 days with cramping but the cramping is on a dramatic part of the problem.She denies any pain with intercourse. Patient is chronically anemic with a MCV of 77 and RDW which is elevated consistent with chronic iron deficiency anemia. Several ferritins of all been low and she is taking supplemental iron. She is on birth control pills for cycle control but even this is not enough to manage her cycles.Interestingly she says when she goes to [facility] by her pads she buys a 3 month supply and only last for 1 cycle.  As a result of all these factors she is admitted for a mini laparotomy approach with a supracervical abdominal hysterectomy and removal of fallopian tubes and planned preservation of the ovaries unless there is pathology associated we are not anticipating. Again her sonogram with the ovaries were normal  Intraoperative findings: 16 weeks size  fibroids, multiple fibroids present, ovaries and tubes were normal  Description of operation:  Patient was taken to the operating room and placed in the supine position where she underwent general endotracheal anesthesia.  She was then prepped and draped in the usual sterile fashion and a Foley catheter was placed for continuous bladder drainage.  A Pfannenstiel skin incision was made and carried down sharply to the rectus fascia which was scored in the midline and extended laterally.  The fascia was taken off the muscles superiorly and inferiorly without difficulty.  The muscles were divided.  The peritoneal cavity was entered.  An medium Alexis self-retaining retractor was placed.  The upper abdomen was packed away. Both uterine cornu were grasped with Coker clamps.  The left round ligament was suture ligated and coagulated with the electrocautery unit.  The left vesicouterine serosal flap was created.  An avascular window in in the peritoneum was created and the utero-ovarian ligament was cross clamped, cut and suture ligated.  The right  round ligament was suture ligated and cut with the electrocautery unit.  The vesicouterine serosal flap on the right was created.  An avascular window in the peritoneum was created and the right utero-ovarian ligament was cross clamped, cut and double suture ligated.  Thus both ovaries were preserved.  The uterine vessels were skeletonized bilaterally.  The uterine vessels were clamped bilaterally,  then cut and suture ligated.  Two more pedicles were taken down the cervix medial to the uterine vessels.  Each pedicle was clamped cut and suture ligated with good resulting hemostasis.  As per the preoperative plan the cervix was then transected sharply and the specimen was removed.  The cervical stump was then closed anterior to posterior for hemostasis and reduce postoperative adhesions.  The pelvis was irrigated vigorously and all pedicles were examined and found to be  hemostatic.  Both fallopian tubes were crossclamped with Kelly clamps and the pedicle was made hemostatic with a fore and aft suture placement.    All specimens were sent to pathology for routine evaluation.  One vial of Arista was used for additional insurance for hemostasis. The Alexis self-retaining retractor was removed and the pelvis was irrigated vigorously.  All packs were removed and all counts were correct at this point x 3.  The muscles and peritoneum were reapproximated loosely.  The fascia was closed with 0 Vicryl running.  The skin was closed using 3-0 Vicryl on a Keith needle in a subcuticular fashion.  Dermabond was then applied for additional wound integrity and to serve as a postoperative bacterial barrier.  The patient was awakened from anesthesia taken to the recovery room in good stable condition. All sponge instrument and needle counts were correct x 3.  The patient received Ancef and Toradol prophylactically preoperatively.  Estimated blood loss for the procedure was 200  cc.  Florian Buff, MD 09/11/2016 2:56 PM

## 2016-09-11 NOTE — Interval H&P Note (Signed)
History and Physical Interval Note:  09/11/2016 12:39 PM  Nichole Vargas  has presented today for surgery, with the diagnosis of Menorrhagia Dysmenorrhea Uterine Leiomyoma  The various methods of treatment have been discussed with the patient and family. After consideration of risks, benefits and other options for treatment, the patient has consented to  Procedure(s): HYSTERECTOMY SUPRACERVICAL ABDOMINAL (N/A) BILATERAL SALPINGECTOMY (Bilateral) as a surgical intervention .  The patient's history has been reviewed, patient examined, no change in status, stable for surgery.  I have reviewed the patient's chart and labs.  Questions were answered to the patient's satisfaction.     Shondrea Steinert H

## 2016-09-11 NOTE — Anesthesia Postprocedure Evaluation (Signed)
Anesthesia Post Note  Patient: Nichole Vargas  Procedure(s) Performed: Procedure(s) (LRB): HYSTERECTOMY SUPRACERVICAL ABDOMINAL (N/A) BILATERAL SALPINGECTOMY (Bilateral)  Patient location during evaluation: PACU Anesthesia Type: General Level of consciousness: awake and alert Pain management: satisfactory to patient Vital Signs Assessment: post-procedure vital signs reviewed and stable Respiratory status: spontaneous breathing Cardiovascular status: stable Postop Assessment: no signs of nausea or vomiting Anesthetic complications: no     Last Vitals:  Vitals:   09/11/16 1545 09/11/16 1600  BP: 124/76 (!) 142/75  Pulse: 63 64  Resp: (!) 0 12  Temp:      Last Pain:  Vitals:   09/11/16 1543  TempSrc:   PainSc: Asleep                 Tesia Lybrand

## 2016-09-12 LAB — BASIC METABOLIC PANEL
ANION GAP: 7 (ref 5–15)
BUN: 7 mg/dL (ref 6–20)
CO2: 26 mmol/L (ref 22–32)
Calcium: 8.1 mg/dL — ABNORMAL LOW (ref 8.9–10.3)
Chloride: 102 mmol/L (ref 101–111)
Creatinine, Ser: 0.98 mg/dL (ref 0.44–1.00)
GFR calc Af Amer: >60 mL/min (ref >60)
Glucose, Bld: 155 mg/dL — ABNORMAL HIGH (ref 65–99)
POTASSIUM: 3.8 mmol/L (ref 3.5–5.1)
SODIUM: 135 mmol/L (ref 135–145)

## 2016-09-12 LAB — CBC
HCT: 26.2 % — ABNORMAL LOW (ref 36.0–46.0)
Hemoglobin: 8.3 g/dL — ABNORMAL LOW (ref 12.0–15.0)
MCH: 24.6 pg — ABNORMAL LOW (ref 26.0–34.0)
MCHC: 31.7 g/dL (ref 30.0–36.0)
MCV: 77.7 fL — ABNORMAL LOW (ref 78.0–100.0)
PLATELETS: 315 10*3/uL (ref 150–400)
RBC: 3.37 MIL/uL — AB (ref 3.87–5.11)
RDW: 13.9 % (ref 11.5–15.5)
WBC: 9.8 10*3/uL (ref 4.0–10.5)

## 2016-09-12 MED ORDER — OXYCODONE-ACETAMINOPHEN 5-325 MG PO TABS: 1.0000 | ORAL_TABLET | ORAL | 0 refills | Status: DC | PRN

## 2016-09-12 MED ORDER — ONDANSETRON HCL 8 MG PO TABS: 8.0000 mg | ORAL_TABLET | Freq: Four times a day (QID) | ORAL | 0 refills | Status: DC | PRN

## 2016-09-12 MED ORDER — KETOROLAC TROMETHAMINE 10 MG PO TABS: 10.0000 mg | ORAL_TABLET | Freq: Three times a day (TID) | ORAL | 0 refills | Status: DC | PRN

## 2016-09-12 NOTE — Progress Notes (Signed)
Pt c/o of mild pain, but refuses medication.  Patient ambulated approx. 350 with stand by assist, from her hospital room, to the nurses station x 2 without c/o.  Pain medication offered, she declines.  Will continue to monitor

## 2016-09-12 NOTE — Discharge Summary (Signed)
Physician Discharge Summary  Patient ID: Nichole Vargas MRN: [id number] DOB/AGE: 10/28/1975 41 y.o.  Admit date: 09/11/2016 Discharge date: 09/12/2016  Admission Diagnoses: Menometrorrhagia Dysmenorrhea Enlarged fibroid uterus, 16 week size Anemia due to chronic blood loss, iron deficiency  Discharge Diagnoses:  Active Problems:   S/P abdominal supracervical subtotal hysterectomy With removal of both fallopian tubes  Discharged Condition: good  Hospital Course: Unremarkable postoperative course  Consults: None  Significant Diagnostic Studies: labs: Postoperative labs were normal  Treatments: surgery: Abdominal supracervical hysterectomy with removal of both fallopian tubes  Discharge Exam: Blood pressure 126/68, pulse (!) 103, temperature 100.1 F (37.8 C), temperature source Oral, resp. rate 20, height 5\' 2"  (1.575 m), weight 135 lb (61.2 kg), last menstrual period 08/16/2016, SpO2 100 %. General appearance: alert, cooperative and no distress GI: soft, non-tender; bowel sounds normal; no masses,  no organomegaly Incision/Wound: Clean dry and intact and normal postoperative exam  Disposition: Final discharge disposition not confirmed  Discharge Instructions    Call MD for:  persistant nausea and vomiting    Complete by:  As directed    Call MD for:  severe uncontrolled pain    Complete by:  As directed    Call MD for:  temperature >100.4    Complete by:  As directed    Diet - low sodium heart healthy    Complete by:  As directed    Driving Restrictions    Complete by:  As directed    No driving for 1 week   Increase activity slowly    Complete by:  As directed    Leave dressing on - Keep it clean, dry, and intact until clinic visit    Complete by:  As directed    Lifting restrictions    Complete by:  As directed    Do not lift more than 10 pounds for 6 weeks   Sexual Activity Restrictions    Complete by:  As directed    NO sex for 6 weeks     Allergies as  of 09/12/2016   No Known Allergies     Medication List    STOP taking these medications   IRON 27 PO   norgestimate-ethinyl estradiol 0.25-35 MG-MCG tablet Commonly known as:  SPRINTEC 28     TAKE these medications   ketorolac 10 MG tablet Commonly known as:  TORADOL Take 1 tablet (10 mg total) by mouth every 8 (eight) hours as needed.   ondansetron 8 MG tablet Commonly known as:  ZOFRAN Take 1 tablet (8 mg total) by mouth every 6 (six) hours as needed for nausea.   oxyCODONE-acetaminophen 5-325 MG tablet Commonly known as:  PERCOCET/ROXICET Take 1-2 tablets by mouth every 4 (four) hours as needed (moderate to severe pain (when tolerating fluids)).      Follow-up Information    Florian Buff, MD Follow up.   Specialties:  Obstetrics and Gynecology, Radiology Contact information: [place] [address] [phone]           Signed: Florian Buff 09/12/2016, 3:05 PM

## 2016-09-12 NOTE — Progress Notes (Signed)
Pt has been OOB to Bathroom multiple times with stand by assist of staff.  She tolerated the liquid diet well and  requested toast for breakfast.  Advanced to regular diet.  Tolerated well.  She states mild pain, but refuses pain medication and has not received any since the recovery room 09/11/2016.   Family is at the bedside.  Will continue to monitor.

## 2016-09-12 NOTE — Discharge Instructions (Signed)
Abdominal Hysterectomy, Care After °This sheet gives you information about how to care for yourself after your procedure. Your health care provider may also give you more specific instructions. If you have problems or questions, contact your health care provider. °What can I expect after the procedure? °After your procedure, it is common to have: °· Pain. °· Fatigue. °· Poor appetite. °· Less interest in sex. °· Vaginal bleeding and discharge. You may need to use a sanitary napkin after this procedure. ° °Follow these instructions at home: °Bathing °· Do not take baths, swim, or use a hot tub until your health care provider approves. Ask your health care provider if you can take showers. You may only be allowed to take sponge baths for bathing. °· Keep the bandage (dressing) dry until your health care provider says it can be removed. °Incision care °· Follow instructions from your health care provider about how to take care of your incision. Make sure you: °? Wash your hands with soap and water before you change your bandage (dressing). If soap and water are not available, use hand sanitizer. °? Change your dressing as told by your health care provider. °? Leave stitches (sutures), skin glue, or adhesive strips in place. These skin closures may need to stay in place for 2 weeks or longer. If adhesive strip edges start to loosen and curl up, you may trim the loose edges. Do not remove adhesive strips completely unless your health care provider tells you to do that. °· Check your incision area every day for signs of infection. Check for: °? Redness, swelling, or pain. °? Fluid or blood. °? Warmth. °? Pus or a bad smell. °Activity °· Do gentle, daily exercises as told by your health care provider. You may be told to take short walks every day and go farther each time. °· Do not lift anything that is heavier than 10 lb (4.5 kg), or the limit that your health care provider tells you, until he or she says that it is  safe. °· Do not drive or use heavy machinery while taking prescription pain medicine. °· Do not drive for 24 hours if you were given a medicine to help you relax (sedative). °· Follow your health care provider's instructions about exercise, driving, and general activities. Ask your health care provider what activities are safe for you. °Lifestyle °· Do not douche, use tampons, or have sex for at least 6 weeks or as told by your health care provider. °· Do not drink alcohol until your health care provider approves. °· Drink enough fluid to keep your urine clear or pale yellow. °· Try to have someone at home with you for the first 1-2 weeks to help. °· Do not use any products that contain nicotine or tobacco, such as cigarettes and e-cigarettes. These can delay healing. If you need help quitting, ask your health care provider. °General instructions °· Take over-the-counter and prescription medicines only as told by your health care provider. °· Do not take aspirin or ibuprofen. These medicines can cause bleeding. °· To prevent or treat constipation while you are taking prescription pain medicine, your health care provider may recommend that you: °? Drink enough fluid to keep your urine clear or pale yellow. °? Take over-the-counter or prescription medicines. °? Eat foods that are high in fiber, such as fresh fruits and vegetables, whole grains, and beans. °? Limit foods that are high in fat and processed sugars, such as fried and sweet foods. °· Keep all   follow-up visits as told by your health care provider. This is important. °Contact a health care provider if: °· You have chills or fever. °· You have redness, swelling, or pain around your incision. °· You have fluid or blood coming from your incision. °· Your incision feels warm to the touch. °· You have pus or a bad smell coming from your incision. °· Your incision breaks open. °· You feel dizzy or light-headed. °· You have pain or bleeding when you urinate. °· You  have persistent diarrhea. °· You have persistent nausea and vomiting. °· You have abnormal vaginal discharge. °· You have a rash. °· You have any type of abnormal reaction or you develop an allergy to your medicine. °· Your pain medicine does not help. °Get help right away if: °· You have a fever and your symptoms suddenly get worse. °· You have severe abdominal pain. °· You have shortness of breath. °· You faint. °· You have pain, swelling, or redness in your leg. °· You have heavy vaginal bleeding with blood clots. °Summary °· After your procedure, it is common to have pain, fatigue and vaginal discharge. °· Do not take baths, swim, or use a hot tub until your health care provider approves. Ask your health care provider if you can take showers. You may only be allowed to take sponge baths for bathing. °· Follow your health care provider's instructions about exercise, driving, and general activities. Ask your health care provider what activities are safe for you. °· Do not lift anything that is heavier than 10 lb (4.5 kg), or the limit that your health care provider tells you, until he or she says that it is safe. °· Try to have someone at home with you for the first 1-2 weeks to help. °This information is not intended to replace advice given to you by your health care provider. Make sure you discuss any questions you have with your health care provider. °Document Released: 08/24/2004 Document Revised: 01/24/2016 Document Reviewed: 01/24/2016 °Elsevier Interactive Patient Education © 2017 [employer] Inc[employer] ° °

## 2016-09-12 NOTE — Anesthesia Postprocedure Evaluation (Signed)
Anesthesia Post Note  Patient: Nichole Vargas  Procedure(s) Performed: Procedure(s) (LRB): HYSTERECTOMY SUPRACERVICAL ABDOMINAL (N/A) BILATERAL SALPINGECTOMY (Bilateral)  Patient location during evaluation: Nursing Unit Anesthesia Type: General Level of consciousness: awake and alert and oriented Pain management: pain level controlled Vital Signs Assessment: post-procedure vital signs reviewed and stable Respiratory status: spontaneous breathing Cardiovascular status: stable Postop Assessment: no signs of nausea or vomiting Anesthetic complications: no     Last Vitals:  Vitals:   09/12/16 0528 09/12/16 1355  BP: 93/61 126/68  Pulse: (!) 115 (!) 103  Resp: 20 20  Temp: 37.9 C 37.8 C    Last Pain:  Vitals:   09/12/16 1355  TempSrc: Oral  PainSc:                  Nichole Vargas

## 2016-09-12 NOTE — Addendum Note (Signed)
Addendum  created 09/12/16 1443 by Ollen Bowl, CRNA   Sign clinical note

## 2016-09-13 ENCOUNTER — Encounter (HOSPITAL_COMMUNITY): Payer: Self-pay | Admitting: Obstetrics & Gynecology

## 2016-09-19 ENCOUNTER — Encounter: Payer: Self-pay | Admitting: Obstetrics & Gynecology

## 2016-09-19 ENCOUNTER — Ambulatory Visit (INDEPENDENT_AMBULATORY_CARE_PROVIDER_SITE_OTHER): Payer: BLUE CROSS/BLUE SHIELD | Admitting: Obstetrics & Gynecology

## 2016-09-19 VITALS — BP 110/62 | HR 76 | Wt 130.5 lb

## 2016-09-19 DIAGNOSIS — Z9889 Other specified postprocedural states: Secondary | ICD-10-CM

## 2016-09-19 DIAGNOSIS — Z90710 Acquired absence of both cervix and uterus: Secondary | ICD-10-CM

## 2016-09-19 NOTE — Progress Notes (Signed)
  HPI: Patient returns for routine postoperative follow-up having undergone supracervical hysterectomy with removal of both tubes on 09/11/2016.  The patient's immediate postoperative recovery has been unremarkable. Since hospital discharge the patient reports no problems. No bowel or bladder complaints   Current Outpatient Prescriptions: ketorolac (TORADOL) 10 MG tablet, Take 1 tablet (10 mg total) by mouth every 8 (eight) hours as needed. (Patient not taking: Reported on 09/19/2016), Disp: 15 tablet, Rfl: 0 ondansetron (ZOFRAN) 8 MG tablet, Take 1 tablet (8 mg total) by mouth every 6 (six) hours as needed for nausea. (Patient not taking: Reported on 09/19/2016), Disp: 20 tablet, Rfl: 0 oxyCODONE-acetaminophen (PERCOCET/ROXICET) 5-325 MG tablet, Take 1-2 tablets by mouth every 4 (four) hours as needed (moderate to severe pain (when tolerating fluids)). (Patient not taking: Reported on 09/19/2016), Disp: 30 tablet, Rfl: 0  No current facility-administered medications for this visit.     Blood pressure 110/62, pulse 76, weight 130 lb 8 oz (59.2 kg), last menstrual period 08/16/2016.  Physical Exam: Incision clean dry intact  Diagnostic Tests: none  Pathology: benign  Impression: S/p supracervical hysterectomy with removal of tubes  Plan: RTW 09/30/2016 4 hours full time 10/07/2016  Follow up: 4  weeks  Florian Buff, MD

## 2016-10-17 ENCOUNTER — Encounter: Payer: Self-pay | Admitting: Obstetrics & Gynecology

## 2016-10-17 ENCOUNTER — Ambulatory Visit (INDEPENDENT_AMBULATORY_CARE_PROVIDER_SITE_OTHER): Payer: BLUE CROSS/BLUE SHIELD | Admitting: Obstetrics & Gynecology

## 2016-10-17 VITALS — BP 118/72 | HR 66 | Wt 130.0 lb

## 2016-10-17 DIAGNOSIS — Z9889 Other specified postprocedural states: Secondary | ICD-10-CM

## 2016-10-17 DIAGNOSIS — Z90710 Acquired absence of both cervix and uterus: Secondary | ICD-10-CM

## 2016-10-17 NOTE — Progress Notes (Signed)
  HPI: Patient returns for routine postoperative follow-up having undergone abdominal supracervical hysterectomy on 09/11/2016.  The patient's immediate postoperative recovery has been unremarkable. Since hospital discharge the patient reports minimal vaginal spotting complaints.   Current Outpatient Prescriptions: ketorolac (TORADOL) 10 MG tablet, Take 1 tablet (10 mg total) by mouth every 8 (eight) hours as needed. (Patient not taking: Reported on 09/19/2016), Disp: 15 tablet, Rfl: 0 ondansetron (ZOFRAN) 8 MG tablet, Take 1 tablet (8 mg total) by mouth every 6 (six) hours as needed for nausea. (Patient not taking: Reported on 09/19/2016), Disp: 20 tablet, Rfl: 0 oxyCODONE-acetaminophen (PERCOCET/ROXICET) 5-325 MG tablet, Take 1-2 tablets by mouth every 4 (four) hours as needed (moderate to severe pain (when tolerating fluids)). (Patient not taking: Reported on 09/19/2016), Disp: 30 tablet, Rfl: 0  No current facility-administered medications for this visit.     Blood pressure 118/72, pulse 66, weight 130 lb (59 kg), last menstrual period 08/16/2016.  Physical Exam: Incision is clean dry and intact Abdominal exam is benign The vagina is without discharge Cervix is normal  Diagnostic Tests:   Pathology: Benign  Impression: Status post abdominal supracervical hysterectomy with removal of both fallopian tubes  Plan: Routine yearly exams  Follow up: 1 year for Pap smear    Florian Buff, MD

## 2017-02-25 ENCOUNTER — Other Ambulatory Visit (HOSPITAL_COMMUNITY)
Admission: RE | Admit: 2017-02-25 | Discharge: 2017-02-25 | Disposition: A | Discharge status: Home / Self Care | Payer: BLUE CROSS/BLUE SHIELD | Source: Ambulatory Visit | Attending: Adult Health | Admitting: Adult Health

## 2017-02-25 ENCOUNTER — Encounter: Payer: Self-pay | Admitting: Adult Health

## 2017-02-25 ENCOUNTER — Ambulatory Visit (INDEPENDENT_AMBULATORY_CARE_PROVIDER_SITE_OTHER): Payer: BLUE CROSS/BLUE SHIELD | Admitting: Adult Health

## 2017-02-25 VITALS — BP 120/68 | HR 78 | Ht 63.0 in | Wt 134.0 lb

## 2017-02-25 DIAGNOSIS — Z1151 Encounter for screening for human papillomavirus (HPV): Secondary | ICD-10-CM | POA: Diagnosis not present

## 2017-02-25 DIAGNOSIS — Z1212 Encounter for screening for malignant neoplasm of rectum: Secondary | ICD-10-CM | POA: Diagnosis not present

## 2017-02-25 DIAGNOSIS — Z01419 Encounter for gynecological examination (general) (routine) without abnormal findings: Secondary | ICD-10-CM | POA: Diagnosis not present

## 2017-02-25 DIAGNOSIS — Z1211 Encounter for screening for malignant neoplasm of colon: Secondary | ICD-10-CM

## 2017-02-25 LAB — HEMOCCULT GUIAC POC 1CARD (OFFICE): Fecal Occult Blood, POC: NEGATIVE

## 2017-02-25 NOTE — Progress Notes (Signed)
Patient ID: Nichole Vargas, female   DOB: 1975/10/17, 42 y.o.   MRN: [id number] History of Present Illness: Nichole Vargas is a 42 year old black female in for well woman gyn exam and pap. She is sp [facility] for fibroids and had anemia. She still works at [employer].   Current Medications, Allergies, Past Medical History, Past Surgical History, Family History and Social History were reviewed in [employer] record.     Review of Systems: Patient denies any headaches, hearing loss, fatigue, blurred vision, shortness of breath, chest pain, abdominal pain, problems with bowel movements, urination, or intercourse. No joint pain or mood swings. Has pain in right side just at hip that comes and goes, and is better when moves around    Physical Exam:BP 120/68 (BP Location: Left Arm, Patient Position: Sitting, Cuff Size: Normal)   Pulse 78   Ht 5\' 3"  (1.6 m)   Wt 134 lb (60.8 kg)   LMP 08/16/2016   BMI 23.74 kg/m  General:  Well developed, well nourished, no acute distress Skin:  Warm and dry Neck:  Midline trachea, normal thyroid, good ROM, no lymphadenopathy Lungs; Clear to auscultation bilaterally Breast:  No dominant palpable mass, retraction, or nipple discharge Cardiovascular: Regular rate and rhythm Abdomen:  Soft, non tender, no hepatosplenomegaly,no point tenderness right side to hip Pelvic:  External genitalia is normal in appearance, no lesions.  The vagina is normal in appearance. Urethra has no lesions or masses. The cervix is bulbous, and smooth, pap with HPV performed.Has brown blood at os.  Uterus is absent.  No adnexal masses or tenderness noted.Bladder is non tender, no masses felt. Rectal: Good sphincter tone, no polyps, or hemorrhoids felt.  Hemoccult negative. Extremities/musculoskeletal:  No swelling or varicosities noted, no clubbing or cyanosis,normal gait Psych:  No mood changes, alert and cooperative,seems happy PHQ 2 score 0. Pt aware can have spotting  with [facility].   Impression:  1. Encounter for gynecological examination with Papanicolaou smear of cervix   2. Screening for colorectal cancer      Plan: Check CBC,CMP,TSH and lipids Mammogram yearly, has one next month Physical in 1 year Pap in 3 if normal Try to keep notice of when side hurts, maybe muscle related

## 2017-02-26 LAB — COMPREHENSIVE METABOLIC PANEL
A/G RATIO: 1.4 (ref 1.2–2.2)
ALBUMIN: 4.3 g/dL (ref 3.5–5.5)
ALT: 9 IU/L (ref 0–32)
AST: 20 IU/L (ref 0–40)
Alkaline Phosphatase: 68 IU/L (ref 39–117)
BILIRUBIN TOTAL: 0.3 mg/dL (ref 0.0–1.2)
BUN / CREAT RATIO: 9 (ref 9–23)
BUN: 8 mg/dL (ref 6–24)
CHLORIDE: 102 mmol/L (ref 96–106)
CO2: 23 mmol/L (ref 20–29)
Calcium: 9.4 mg/dL (ref 8.7–10.2)
Creatinine, Ser: 0.94 mg/dL (ref 0.57–1.00)
GFR calc non Af Amer: 76 mL/min/{1.73_m2} (ref >59)
GFR, EST AFRICAN AMERICAN: 87 mL/min/{1.73_m2} (ref >59)
Globulin, Total: 3 g/dL (ref 1.5–4.5)
Glucose: 85 mg/dL (ref 65–99)
POTASSIUM: 4.1 mmol/L (ref 3.5–5.2)
Sodium: 141 mmol/L (ref 134–144)
TOTAL PROTEIN: 7.3 g/dL (ref 6.0–8.5)

## 2017-02-26 LAB — LIPID PANEL
CHOL/HDL RATIO: 3.6 ratio (ref 0.0–4.4)
Cholesterol, Total: 133 mg/dL (ref 100–199)
HDL: 37 mg/dL — ABNORMAL LOW (ref >39)
LDL Calculated: 78 mg/dL (ref 0–99)
Triglycerides: 88 mg/dL (ref 0–149)
VLDL Cholesterol Cal: 18 mg/dL (ref 5–40)

## 2017-02-26 LAB — CBC
HEMOGLOBIN: 12.8 g/dL (ref 11.1–15.9)
Hematocrit: 39.5 % (ref 34.0–46.6)
MCH: 27.1 pg (ref 26.6–33.0)
MCHC: 32.4 g/dL (ref 31.5–35.7)
MCV: 84 fL (ref 79–97)
PLATELETS: 245 10*3/uL (ref 150–379)
RBC: 4.72 x10E6/uL (ref 3.77–5.28)
RDW: 17.1 % — ABNORMAL HIGH (ref 12.3–15.4)
WBC: 4.5 10*3/uL (ref 3.4–10.8)

## 2017-02-26 LAB — TSH: TSH: 3.6 u[IU]/mL (ref 0.450–4.500)

## 2017-02-27 LAB — CYTOLOGY - PAP
DIAGNOSIS: NEGATIVE
HPV: NOT DETECTED

## 2017-03-24 ENCOUNTER — Encounter (HOSPITAL_COMMUNITY): Payer: Self-pay

## 2017-03-24 ENCOUNTER — Ambulatory Visit (HOSPITAL_COMMUNITY)
Admission: RE | Admit: 2017-03-24 | Discharge: 2017-03-24 | Disposition: A | Discharge status: Home / Self Care | Payer: BLUE CROSS/BLUE SHIELD | Source: Ambulatory Visit | Attending: Adult Health | Admitting: Adult Health

## 2017-03-24 DIAGNOSIS — Z1231 Encounter for screening mammogram for malignant neoplasm of breast: Secondary | ICD-10-CM | POA: Insufficient documentation

## 2018-02-23 ENCOUNTER — Other Ambulatory Visit (HOSPITAL_COMMUNITY): Payer: Self-pay | Admitting: Adult Health

## 2018-02-23 ENCOUNTER — Ambulatory Visit (HOSPITAL_COMMUNITY): Payer: BLUE CROSS/BLUE SHIELD

## 2018-02-23 DIAGNOSIS — Z1231 Encounter for screening mammogram for malignant neoplasm of breast: Secondary | ICD-10-CM

## 2018-03-30 ENCOUNTER — Ambulatory Visit (HOSPITAL_COMMUNITY)
Admission: RE | Admit: 2018-03-30 | Discharge: 2018-03-30 | Disposition: A | Discharge status: Home / Self Care | Payer: BLUE CROSS/BLUE SHIELD | Source: Ambulatory Visit | Attending: Adult Health | Admitting: Adult Health

## 2018-03-30 DIAGNOSIS — Z1231 Encounter for screening mammogram for malignant neoplasm of breast: Secondary | ICD-10-CM

## 2018-07-16 ENCOUNTER — Telehealth: Payer: Self-pay | Admitting: Adult Health

## 2018-07-16 NOTE — Telephone Encounter (Signed)
Pt states she had a hysterectomy, but is having some bleeding and requesting to be seen.

## 2018-07-16 NOTE — Telephone Encounter (Signed)
Pt had some bright red spotting the other day. Lasted 1 day. Pt has had a hyst. Advised she needs an appt. Pt will call back tomorrow to schedule. [place]

## 2018-07-20 ENCOUNTER — Encounter: Payer: Self-pay | Admitting: *Deleted

## 2018-07-21 ENCOUNTER — Encounter: Payer: Self-pay | Admitting: Adult Health

## 2018-07-21 ENCOUNTER — Other Ambulatory Visit: Payer: Self-pay

## 2018-07-21 ENCOUNTER — Ambulatory Visit (INDEPENDENT_AMBULATORY_CARE_PROVIDER_SITE_OTHER): Payer: 59 | Admitting: Adult Health

## 2018-07-21 VITALS — BP 121/79 | HR 76 | Ht 62.0 in | Wt 139.6 lb

## 2018-07-21 DIAGNOSIS — Z90710 Acquired absence of both cervix and uterus: Secondary | ICD-10-CM

## 2018-07-21 DIAGNOSIS — N920 Excessive and frequent menstruation with regular cycle: Secondary | ICD-10-CM

## 2018-07-21 NOTE — Progress Notes (Addendum)
Patient ID: Nichole Vargas, female   DOB: 08-02-1975, 43 y.o.   MRN: [id number] History of Present Illness: Nichole Vargas is a 43 year old black female, sp [facility] 08/2016, in complaining of bright red spotting for 1 day, no pain associated with it.    Current Medications, Allergies, Past Medical History, Past Surgical History, Family History and Social History were reviewed in [employer] record.     Review of Systems: Had bright red spotting for 1 day, no pain Occasional discharge, no itching or burning Pt is sexually active, no problems     Physical Exam:BP 121/79 (BP Location: Right Arm, Patient Position: Sitting, Cuff Size: Normal)   Pulse 76   Ht 5\' 2"  (1.575 m)   Wt 139 lb 9.6 oz (63.3 kg)   LMP 08/16/2016 Comment: [facility] 7/18  BMI 25.53 kg/m  General:  Well developed, well nourished, no acute distress Skin:  Warm and dry Pelvic:  External genitalia is normal in appearance, no lesions.  The vagina is normal in appearance, no blood in vault today. Urethra has no lesions or masses. The cervix is bulbous, and smooth, no CMT.  Uterus is absent..  No adnexal masses or tenderness noted.Bladder is non tender, no masses felt. Psych:  No mood changes, alert and cooperative,seems happy Fall risk is low. Examination chaperoned by Diona Fanti CMA.  Discussed could be hormonal, where there is still a small amount of endometrial tissue at cervix, did discuss with Dr Elonda Husky, as he performed her surgery and he agrees.    Impression: 1. Spotting   2. S/P hysterectomy       Plan: Physical in January  Call if any concerns

## 2018-08-13 ENCOUNTER — Other Ambulatory Visit: Payer: Self-pay | Admitting: Adult Health

## 2018-08-24 ENCOUNTER — Other Ambulatory Visit: Payer: Self-pay | Admitting: Internal Medicine

## 2018-08-24 ENCOUNTER — Other Ambulatory Visit: Payer: 59

## 2018-08-24 DIAGNOSIS — Z20822 Contact with and (suspected) exposure to covid-19: Secondary | ICD-10-CM

## 2018-08-29 LAB — NOVEL CORONAVIRUS, NAA: SARS-CoV-2, NAA: NOT DETECTED

## 2018-11-17 ENCOUNTER — Other Ambulatory Visit: Payer: Self-pay

## 2018-11-17 DIAGNOSIS — Z20822 Contact with and (suspected) exposure to covid-19: Secondary | ICD-10-CM

## 2018-11-18 LAB — NOVEL CORONAVIRUS, NAA: SARS-CoV-2, NAA: NOT DETECTED

## 2019-03-01 ENCOUNTER — Other Ambulatory Visit (HOSPITAL_COMMUNITY): Payer: Self-pay | Admitting: Adult Health

## 2019-03-01 ENCOUNTER — Encounter: Payer: Self-pay | Admitting: Adult Health

## 2019-03-01 ENCOUNTER — Ambulatory Visit (INDEPENDENT_AMBULATORY_CARE_PROVIDER_SITE_OTHER): Payer: 59 | Admitting: Adult Health

## 2019-03-01 ENCOUNTER — Other Ambulatory Visit: Payer: Self-pay

## 2019-03-01 VITALS — BP 134/79 | HR 90 | Ht 63.0 in | Wt 142.5 lb

## 2019-03-01 DIAGNOSIS — Z1231 Encounter for screening mammogram for malignant neoplasm of breast: Secondary | ICD-10-CM

## 2019-03-01 DIAGNOSIS — Z1211 Encounter for screening for malignant neoplasm of colon: Secondary | ICD-10-CM | POA: Diagnosis not present

## 2019-03-01 DIAGNOSIS — Z1212 Encounter for screening for malignant neoplasm of rectum: Secondary | ICD-10-CM | POA: Diagnosis not present

## 2019-03-01 DIAGNOSIS — Z01419 Encounter for gynecological examination (general) (routine) without abnormal findings: Secondary | ICD-10-CM | POA: Diagnosis not present

## 2019-03-01 LAB — HEMOCCULT GUIAC POC 1CARD (OFFICE): Fecal Occult Blood, POC: NEGATIVE

## 2019-03-01 NOTE — Progress Notes (Signed)
Patient ID: Nichole Vargas, female   DOB: [birth year], 44 y.o.   MRN: [id number] History of Present Illness: Nichole Vargas is a 44 year old black female, single, sp [facility]. In for a well woman gyn exam, had negative pap with negative HPV 02/25/17. Still working and doing good.    Current Medications, Allergies, Past Medical History, Past Surgical History, Family History and Social History were reviewed in [employer] record.     Review of Systems:  Patient denies any headaches, hearing loss, fatigue, blurred vision, shortness of breath, chest pain, abdominal pain, problems with bowel movements, urination, or intercourse. No joint pain or mood swings.   Physical Exam:BP 134/79 (BP Location: Left Arm, Patient Position: Sitting, Cuff Size: Normal)   Pulse 90   Ht 5\' 3"  (1.6 m)   Wt 142 lb 8 oz (64.6 kg)   LMP 08/16/2016 Comment: [facility] 7/18  BMI 25.24 kg/m  General:  Well developed, well nourished, no acute distress Skin:  Warm and dry Neck:  Midline trachea, normal thyroid, good ROM, no lymphadenopathy Lungs; Clear to auscultation bilaterally Breast:  No dominant palpable mass, retraction, or nipple discharge Cardiovascular: Regular rate and rhythm Abdomen:  Soft, non tender, no hepatosplenomegaly Pelvic:  External genitalia is normal in appearance, no lesions.  The vagina is normal in appearance. Urethra has no lesions or masses. The cervix is bulbous, and smooth..  Uterus is absent.  No adnexal masses or tenderness noted.Bladder is non tender, no masses felt. Rectal: Good sphincter tone, no polyps, or hemorrhoids felt.  Hemoccult negative. Extremities/musculoskeletal:  No swelling or varicosities noted, no clubbing or cyanosis Psych:  No mood changes, alert and cooperative,seems happy Fall risk is low PHQ 2 score is 0. She declines labs this year. Examination chaperoned by Levy Pupa LPN.  Impression and Plan:  1. Encounter for well woman exam with routine gynecological  exam Pap and physical in 1 year Fasting labs next year Mammogram in February and yearly  2. Screening for colorectal cancer Colonoscopy at 44

## 2019-04-05 ENCOUNTER — Other Ambulatory Visit: Payer: Self-pay

## 2019-04-05 ENCOUNTER — Ambulatory Visit (HOSPITAL_COMMUNITY)
Admission: RE | Admit: 2019-04-05 | Discharge: 2019-04-05 | Disposition: A | Discharge status: Home / Self Care | Payer: 59 | Source: Ambulatory Visit | Attending: Adult Health | Admitting: Adult Health

## 2019-04-05 DIAGNOSIS — Z1231 Encounter for screening mammogram for malignant neoplasm of breast: Secondary | ICD-10-CM | POA: Diagnosis not present

## 2020-02-23 ENCOUNTER — Other Ambulatory Visit (HOSPITAL_COMMUNITY): Payer: Self-pay | Admitting: Adult Health

## 2020-02-23 DIAGNOSIS — Z1231 Encounter for screening mammogram for malignant neoplasm of breast: Secondary | ICD-10-CM

## 2020-03-06 ENCOUNTER — Ambulatory Visit (HOSPITAL_COMMUNITY): Payer: 59

## 2020-03-21 ENCOUNTER — Other Ambulatory Visit: Payer: Self-pay

## 2020-03-21 ENCOUNTER — Other Ambulatory Visit (HOSPITAL_COMMUNITY)
Admission: RE | Admit: 2020-03-21 | Discharge: 2020-03-21 | Disposition: A | Discharge status: Home / Self Care | Payer: 59 | Source: Ambulatory Visit | Attending: Adult Health | Admitting: Adult Health

## 2020-03-21 ENCOUNTER — Encounter: Payer: Self-pay | Admitting: Adult Health

## 2020-03-21 ENCOUNTER — Ambulatory Visit (INDEPENDENT_AMBULATORY_CARE_PROVIDER_SITE_OTHER): Payer: 59 | Admitting: Adult Health

## 2020-03-21 VITALS — BP 134/76 | HR 73 | Ht 62.5 in | Wt 129.0 lb

## 2020-03-21 DIAGNOSIS — Z01419 Encounter for gynecological examination (general) (routine) without abnormal findings: Secondary | ICD-10-CM | POA: Diagnosis present

## 2020-03-21 DIAGNOSIS — Z1211 Encounter for screening for malignant neoplasm of colon: Secondary | ICD-10-CM

## 2020-03-21 LAB — HEMOCCULT GUIAC POC 1CARD (OFFICE): Fecal Occult Blood, POC: NEGATIVE

## 2020-03-21 NOTE — Progress Notes (Signed)
Patient ID: Nichole Vargas, female   DOB: [birth year], 45 y.o.   MRN: [id number] History of Present Illness: Nichole Vargas is a 45 year old black female,single, sp [facility] in for a well woman gyn and pap.She is still working at [employer].     Current Medications, Allergies, Past Medical History, Past Surgical History, Family History and Social History were reviewed in [employer] record.     Review of Systems: Patient denies any headaches, hearing loss, fatigue, blurred vision, shortness of breath, chest pain, abdominal pain, problems with bowel movements, urination, or intercourse(not active). No joint pain or mood swings. Has some spotting still, since [facility].   Physical Exam:BP 134/76 (BP Location: Left Arm, Patient Position: Sitting, Cuff Size: Normal)   Pulse 73   Ht 5' 2.5" (1.588 m)   Wt 129 lb (58.5 kg)   LMP 08/16/2016 Comment: [facility] 7/18  BMI 23.22 kg/m  General:  Well developed, well nourished, no acute distress Skin:  Warm and dry Neck:  Midline trachea, normal thyroid, good ROM, no lymphadenopathy Lungs; Clear to auscultation bilaterally Breast:  No dominant palpable mass, retraction, or nipple discharge Cardiovascular: Regular rate and rhythm Abdomen:  Soft, non tender, no hepatosplenomegaly Pelvic:  External genitalia is normal in appearance, no lesions.  The vagina is normal in appearance. Urethra has no lesions or masses. The cervix is smooth.stenotic os, pap with HRHPV genotyping performed.  Uterus is absent..  No adnexal masses or tenderness noted.Bladder is non tender, no masses felt. Rectal: Good sphincter tone, no polyps, or hemorrhoids felt.  Hemoccult negative. Extremities/musculoskeletal:  No swelling or varicosities noted, no clubbing or cyanosis Psych:  No mood changes, alert and cooperative,seems happy AA is 0 Fall risk is low PHQ 9 score is 0 GAD 7 score is 0  [facility] - 03/21/20 5681      Pregnancy Intention Screening   Does the patient  want to become pregnant in the next year? N/A    Does the patient's partner want to become pregnant in the next year? N/A    Would the patient like to discuss contraceptive options today? N/A      Contraception Wrap Up   Current Method Female Sterilization   [facility]   End Method Female Sterilization   [facility]   Contraception Counseling Provided No         Examination chaperoned by Levy Pupa LPN   Impression and Plan: 1. Encounter for gynecological examination with Papanicolaou smear of cervix Pap sent Physical in 1 year  Pap in 3 if normal Pt declines labs Mammogram yearly Discussed get Cologuard or colonoscopy at 45-50 Try to find PCP  She did get 2 COVID vaccines But she declines flu shot    2. Encounter for screening fecal occult blood testing

## 2020-03-24 LAB — CYTOLOGY - PAP
Comment: NEGATIVE
Diagnosis: NEGATIVE
High risk HPV: NEGATIVE

## 2020-04-06 ENCOUNTER — Other Ambulatory Visit: Payer: Self-pay

## 2020-04-06 ENCOUNTER — Ambulatory Visit (HOSPITAL_COMMUNITY)
Admission: RE | Admit: 2020-04-06 | Discharge: 2020-04-06 | Disposition: A | Discharge status: Home / Self Care | Payer: 59 | Source: Ambulatory Visit | Attending: Adult Health | Admitting: Adult Health

## 2020-04-06 DIAGNOSIS — Z1231 Encounter for screening mammogram for malignant neoplasm of breast: Secondary | ICD-10-CM | POA: Diagnosis present

## 2020-04-08 IMAGING — MG DIGITAL SCREENING BILAT W/ TOMO W/ CAD
8 series · 9 of 24 positions shown · non-contrast
Comparison: Previous exam(s).

CLINICAL DATA: Screening.

EXAM:
DIGITAL SCREENING BILATERAL MAMMOGRAM WITH TOMO AND CAD

[L CC synth-2D]
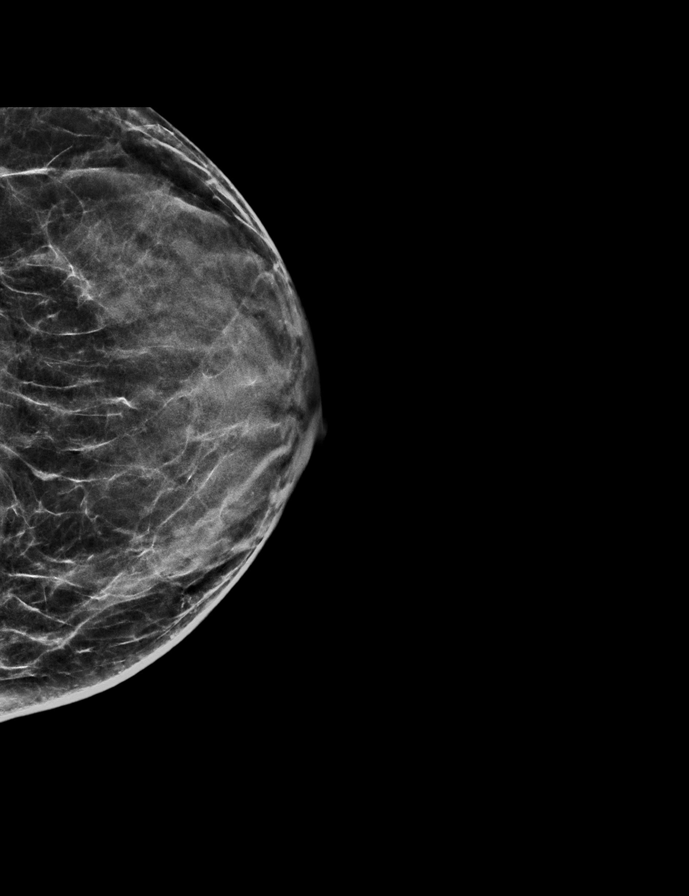

[L MLO synth-2D]
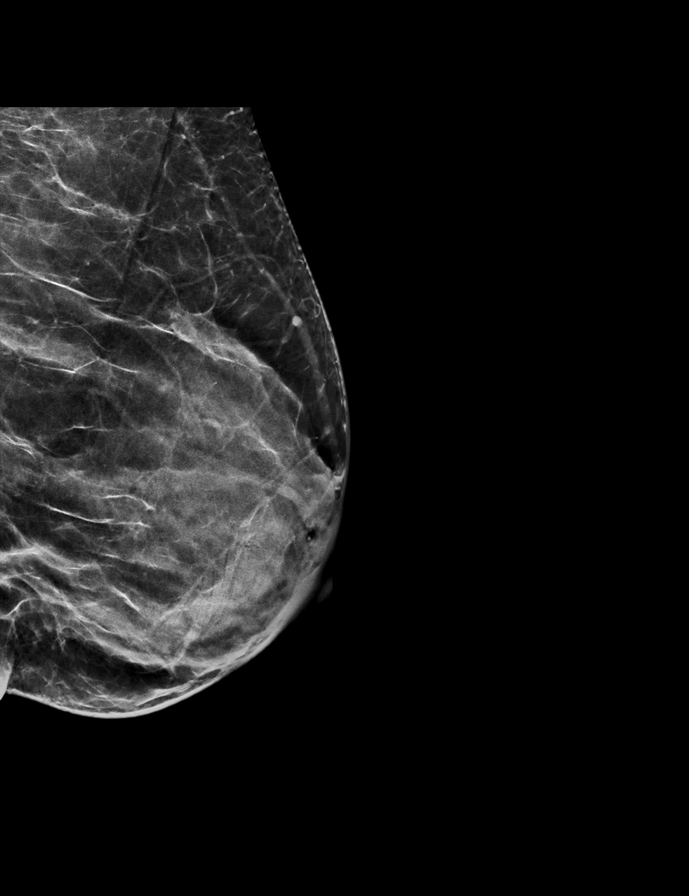

[R MLO synth-2D]
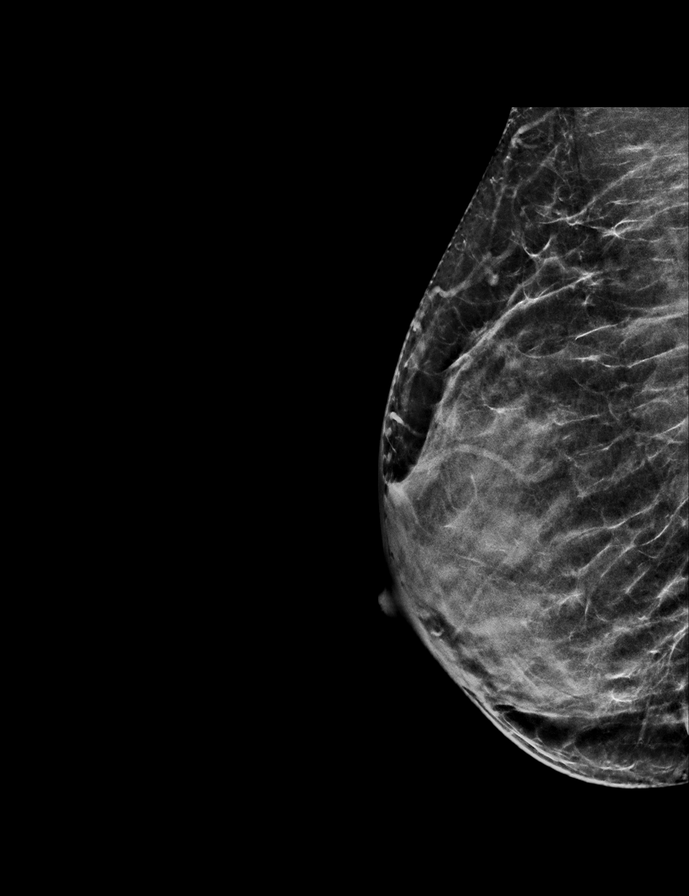

[R CC synth-2D]
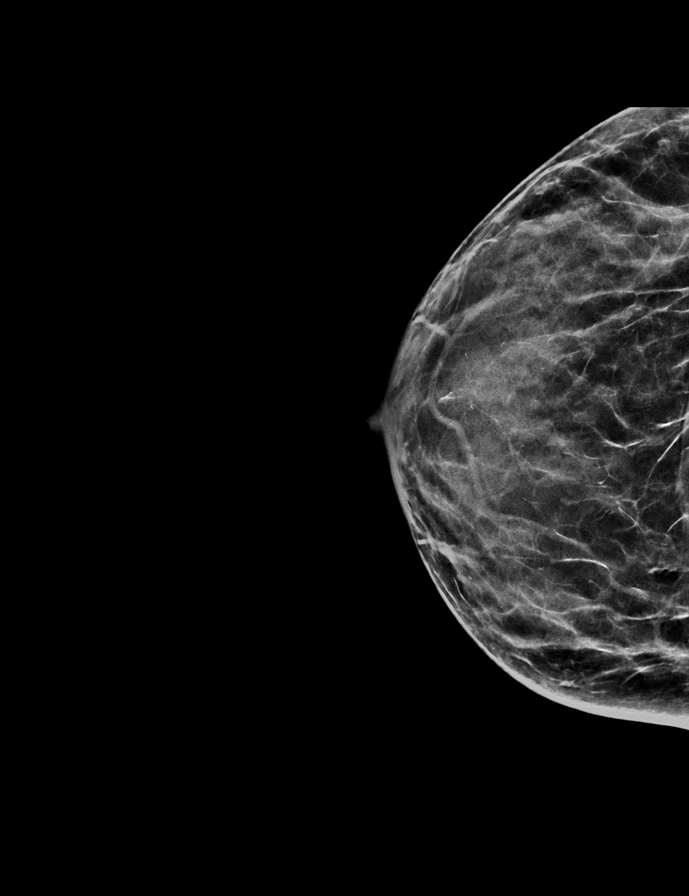

[L MLO tomo · 2 of 57 frames shown]
[frame 19/57]
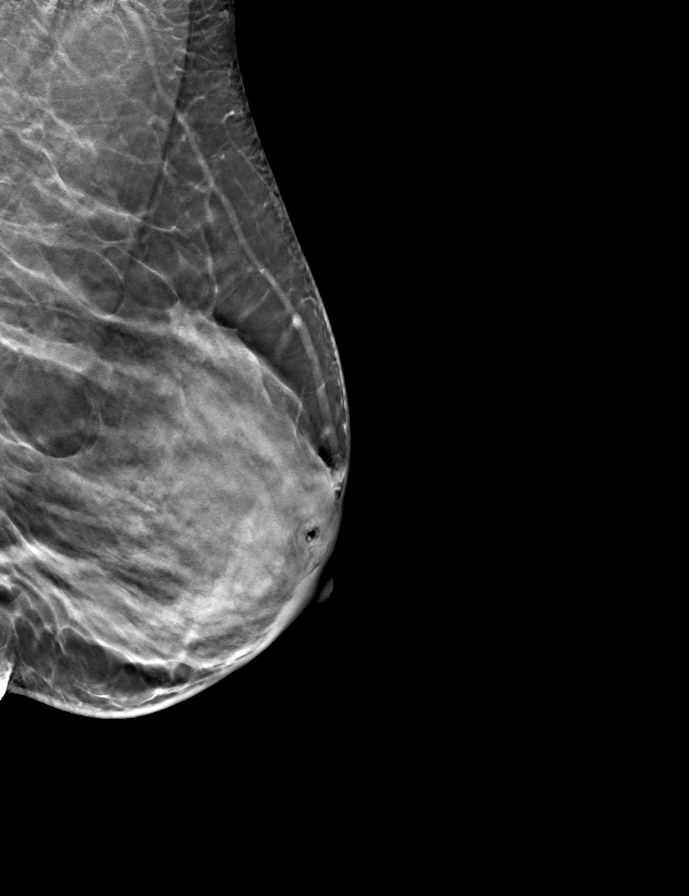
[frame 29/57]
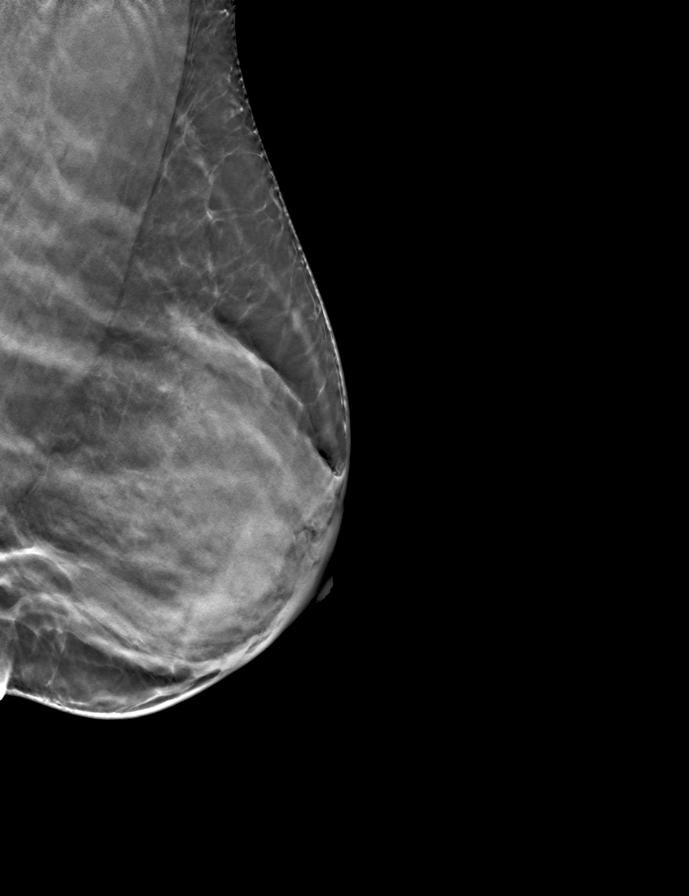

[R CC tomo · tomo slice 26/51.0]
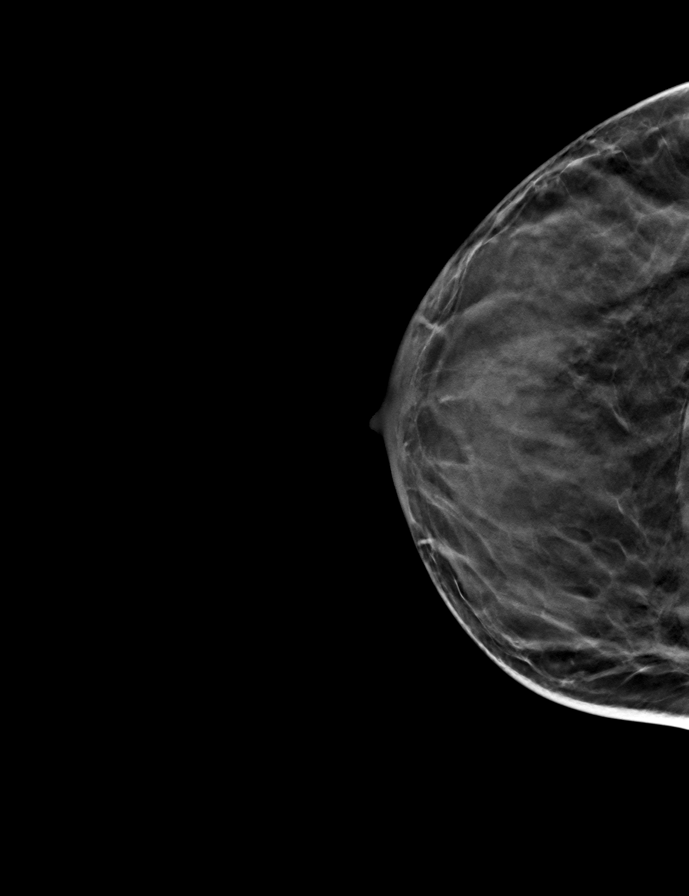

[L CC tomo · tomo slice 27/53.0]
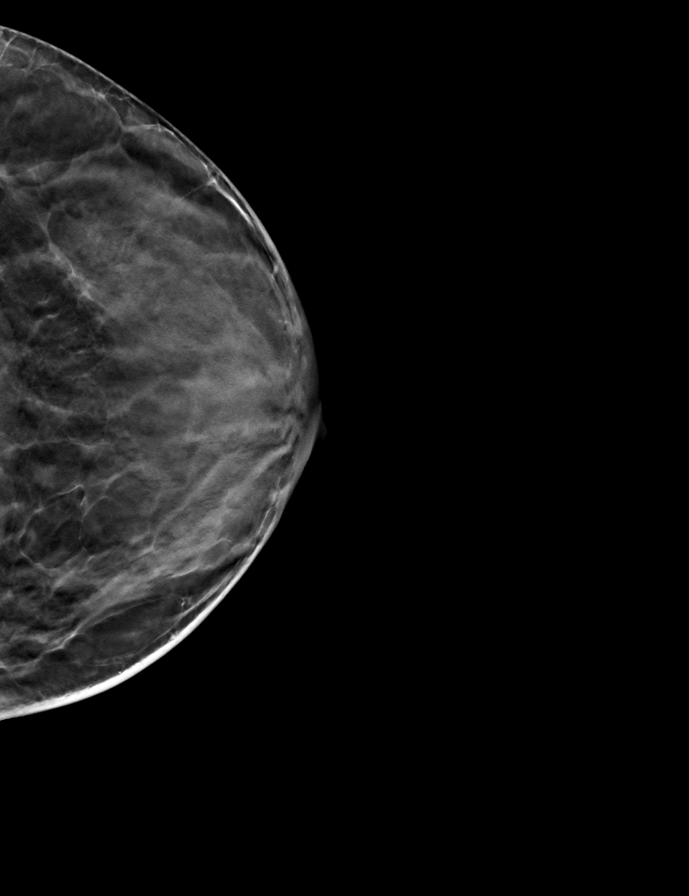

[R MLO tomo · tomo slice 26/51.0]
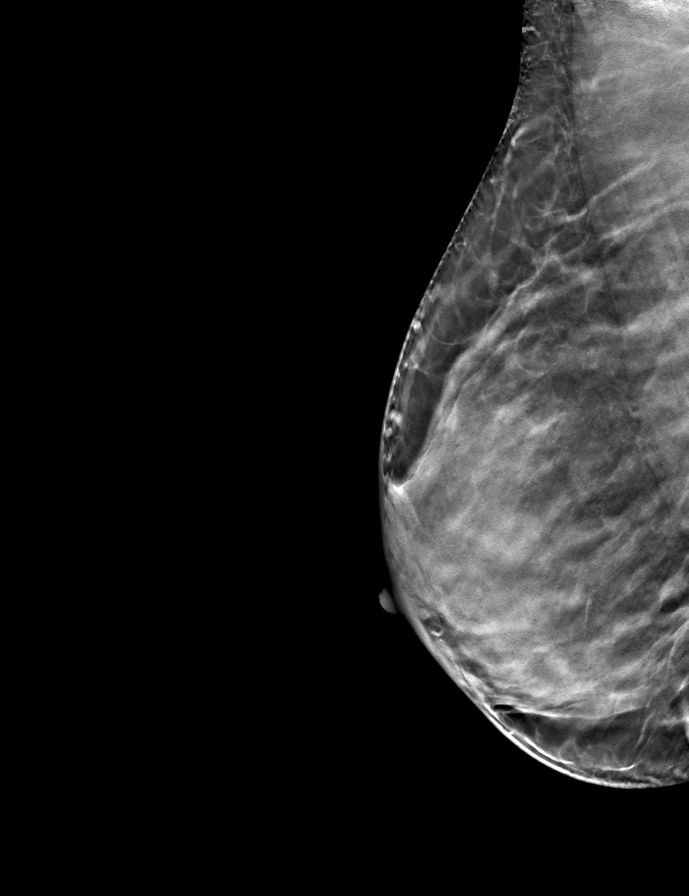

[9 of 24 positions shown; findings below may reference images not displayed]

ACR Breast Density Category c: The breast tissue is heterogeneously
dense, which may obscure small masses.
FINDINGS: There are no findings suspicious for malignancy. Images were
processed with CAD.
IMPRESSION: No mammographic evidence of malignancy. A result letter of this
screening mammogram will be mailed directly to the patient.

RECOMMENDATION:
Screening mammogram in one year. (Code:[id number]-U-LHB)

BI-RADS CATEGORY  1: Negative.

## 2020-04-25 ENCOUNTER — Ambulatory Visit (INDEPENDENT_AMBULATORY_CARE_PROVIDER_SITE_OTHER): Payer: 59 | Admitting: Internal Medicine

## 2020-04-25 ENCOUNTER — Other Ambulatory Visit: Payer: Self-pay

## 2020-04-25 VITALS — BP 158/78 | HR 69 | Temp 98.8°F | Resp 18 | Ht 62.0 in | Wt 129.4 lb

## 2020-04-25 DIAGNOSIS — Z2821 Immunization not carried out because of patient refusal: Secondary | ICD-10-CM | POA: Diagnosis not present

## 2020-04-25 DIAGNOSIS — I10 Essential (primary) hypertension: Secondary | ICD-10-CM

## 2020-04-25 DIAGNOSIS — Z7689 Persons encountering health services in other specified circumstances: Secondary | ICD-10-CM | POA: Diagnosis not present

## 2020-04-25 DIAGNOSIS — R030 Elevated blood-pressure reading, without diagnosis of hypertension: Secondary | ICD-10-CM | POA: Insufficient documentation

## 2020-04-25 MED ORDER — AMLODIPINE BESYLATE 5 MG PO TABS
5.0000 mg | ORAL_TABLET | Freq: Every day | ORAL | 2 refills | Status: DC
Start: 2020-04-25 — End: 2020-06-07

## 2020-04-25 NOTE — Progress Notes (Signed)
New Patient Office Visit  Subjective:  Patient ID: Nichole Vargas, female    DOB: [birth year]  Age: 45 y.o. MRN: [id number]  CC:  Chief Complaint  Patient presents with  . New Patient (Initial Visit)    New patient just establishing care    HPI Nichole Vargas is a 45 year old female with PMH of fibroid s/p hysterectomy and anemia who presents for establishing care.  She has been doing well overall.  Her BP was elevated today on multiple measurements. She denies any headache, dizziness, chest pain, dyspnea or palpitations. Her mother has h/o HTN and DM. Patient's BP at her OB/GYN visits have been around 130s/70s.  She has had 2 doses of COVID vaccine. She denies flu vaccine.  Past Medical History:  Diagnosis Date  . Anemia 04/11/2016  . Contraceptive management 01/28/2013  . Fibroids 07/24/2016  . Hematuria 04/11/2016  . History of HPV infection 01/28/2013  . HPV (human papilloma virus) anogenital infection   . Vaginal itching 02/23/2015  . Yeast infection 02/23/2015    Past Surgical History:  Procedure Laterality Date  . ABDOMINAL HYSTERECTOMY N/A    Phreesia 04/25/2020  . BILATERAL SALPINGECTOMY Bilateral 09/11/2016   Procedure: BILATERAL SALPINGECTOMY;  Surgeon: Florian Buff, MD;  Location: [facility];  Service: Gynecology;  Laterality: Bilateral;  . SUPRACERVICAL ABDOMINAL HYSTERECTOMY N/A 09/11/2016   Procedure: HYSTERECTOMY SUPRACERVICAL ABDOMINAL;  Surgeon: Florian Buff, MD;  Location: [facility];  Service: Gynecology;  Laterality: N/A;  . TOOTH EXTRACTION      Family History  Problem Relation Age of Onset  . Hypertension Mother     Social History   Socioeconomic History  . Marital status: Single    Spouse name: Not on file  . Number of children: 2  . Years of education: Not on file  . Highest education level: Not on file  Occupational History  . Not on file  Tobacco Use  . Smoking status: Never Smoker  . Smokeless tobacco: Never Used  Vaping Use  . Vaping  Use: Never used  Substance and Sexual Activity  . Alcohol use: No  . Drug use: No  . Sexual activity: Yes    Birth control/protection: Surgical    Comment: supracervical hyst  Other Topics Concern  . Not on file  Social History Narrative  . Not on file   Social Determinants of Health   Financial Resource Strain: Low Risk   . Difficulty of Paying Living Expenses: Not hard at all  Food Insecurity: No Food Insecurity  . Worried About Charity fundraiser in the Last Year: Never true  . Ran Out of Food in the Last Year: Never true  Transportation Needs: No Transportation Needs  . Lack of Transportation (Medical): No  . Lack of Transportation (Non-Medical): No  Physical Activity: Insufficiently Active  . Days of Exercise per Week: 2 days  . Minutes of Exercise per Session: 10 min  Stress: No Stress Concern Present  . Feeling of Stress : Only a little  Social Connections: Moderately Isolated  . Frequency of Communication with Friends and Family: More than three times a week  . Frequency of Social Gatherings with Friends and Family: More than three times a week  . Attends Religious Services: More than 4 times per year  . Active Member of Clubs or Organizations: No  . Attends Archivist Meetings: Never  . Marital Status: Never married  Intimate Partner Violence: Not At Risk  . Fear of Current or Ex-Partner:  No  . Emotionally Abused: No  . Physically Abused: No  . Sexually Abused: No    ROS Review of Systems  Constitutional: Negative for chills and fever.  HENT: Negative for congestion, sinus pressure, sinus pain and sore throat.   Eyes: Negative for pain and discharge.  Respiratory: Negative for cough and shortness of breath.   Cardiovascular: Negative for chest pain and palpitations.  Gastrointestinal: Negative for abdominal pain, constipation, diarrhea, nausea and vomiting.  Endocrine: Negative for polydipsia and polyuria.  Genitourinary: Negative for dysuria and  hematuria.  Musculoskeletal: Negative for neck pain and neck stiffness.  Skin: Negative for rash.  Neurological: Negative for dizziness and weakness.  Psychiatric/Behavioral: Negative for agitation and behavioral problems.    Objective:   Today's Vitals: BP (!) 158/78 (BP Location: Right Arm, Patient Position: Sitting)   Pulse 69   Temp 98.8 F (37.1 C) (Oral)   Resp 18   Ht $R'5\' 2"'EY$  (1.575 m)   Wt 129 lb 6.4 oz (58.7 kg)   LMP 08/16/2016 Comment: [facility] 7/18  SpO2 99%   BMI 23.67 kg/m   Physical Exam Vitals reviewed.  Constitutional:      General: She is not in acute distress.    Appearance: She is not diaphoretic.  HENT:     Head: Normocephalic and atraumatic.     Nose: Nose normal.     Mouth/Throat:     Mouth: Mucous membranes are moist.  Eyes:     General: No scleral icterus.    Extraocular Movements: Extraocular movements intact.     Pupils: Pupils are equal, round, and reactive to light.  Cardiovascular:     Rate and Rhythm: Normal rate and regular rhythm.     Pulses: Normal pulses.     Heart sounds: Normal heart sounds. No murmur heard.   Pulmonary:     Breath sounds: Normal breath sounds. No wheezing or rales.  Abdominal:     Palpations: Abdomen is soft.     Tenderness: There is no abdominal tenderness.  Musculoskeletal:     Cervical back: Neck supple. No tenderness.     Right lower leg: No edema.     Left lower leg: No edema.  Skin:    General: Skin is warm.     Findings: No rash.  Neurological:     General: No focal deficit present.     Mental Status: She is alert and oriented to person, place, and time.  Psychiatric:        Mood and Affect: Mood normal.        Behavior: Behavior normal.     Assessment & Plan:   Problem List Items Addressed This Visit      Cardiovascular and Mediastinum   Primary hypertension    BP Readings from Last 1 Encounters:  04/25/20 (!) 158/78   New-onset, reviewed BP records from previous office visits at OB/GYN, has  been > 130/70 in the last 2 visits Started Amlodipine 5 mg QD Counseled for compliance with the medications Advised DASH diet and moderate exercise/walking, at least 150 mins/week       Relevant Medications   amLODipine (NORVASC) 5 MG tablet   Other Relevant Orders   CBC   CMP14+EGFR   TSH     Other   Encounter to establish care - Primary    Care established Previous chart reviewed History and medications reviewed with the patient      Relevant Orders   CBC   CMP14+EGFR   Hemoglobin  A1c   Lipid panel   TSH   Vitamin D (25 hydroxy)    Other Visit Diagnoses    Influenza vaccine refused          Outpatient Encounter Medications as of 04/25/2020  Medication Sig  . amLODipine (NORVASC) 5 MG tablet Take 1 tablet (5 mg total) by mouth daily.   No facility-administered encounter medications on file as of 04/25/2020.    Follow-up: Return in about 6 weeks (around 06/06/2020) for HTN.   Lindell Spar, MD

## 2020-04-25 NOTE — Assessment & Plan Note (Signed)
Care established Previous chart reviewed History and medications reviewed with the patient 

## 2020-04-25 NOTE — Assessment & Plan Note (Signed)
BP Readings from Last 1 Encounters:  04/25/20 (!) 158/78   New-onset, reviewed BP records from previous office visits at OB/GYN, has been > 130/70 in the last 2 visits Started Amlodipine 5 mg QD Counseled for compliance with the medications Advised DASH diet and moderate exercise/walking, at least 150 mins/week

## 2020-04-25 NOTE — Patient Instructions (Addendum)
Please start taking Amlodipine as prescribed.  Please follow DASH diet and perform moderate exercise/walking as tolerated. [email or web address]">  DASH Eating Plan DASH stands for Dietary Approaches to Stop Hypertension. The DASH eating plan is a healthy eating plan that has been shown to:  Reduce high blood pressure (hypertension).  Reduce your risk for type 2 diabetes, heart disease, and stroke.  Help with weight loss. What are tips for following this plan? Reading food labels  Check food labels for the amount of salt (sodium) per serving. Choose foods with less than 5 percent of the Daily Value of sodium. Generally, foods with less than 300 milligrams (mg) of sodium per serving fit into this eating plan.  To find whole grains, look for the word "whole" as the first word in the ingredient list. Shopping  Buy products labeled as "low-sodium" or "no salt added."  Buy fresh foods. Avoid canned foods and pre-made or frozen meals. Cooking  Avoid adding salt when cooking. Use salt-free seasonings or herbs instead of table salt or sea salt. Check with your health care provider or pharmacist before using salt substitutes.  Do not fry foods. Cook foods using healthy methods such as baking, boiling, grilling, roasting, and broiling instead.  Cook with heart-healthy oils, such as olive, canola, avocado, soybean, or sunflower oil. Meal planning  Eat a balanced diet that includes: ? 4 or more servings of fruits and 4 or more servings of vegetables each day. Try to fill one-half of your plate with fruits and vegetables. ? 6-8 servings of whole grains each day. ? Less than 6 oz (170 g) of lean meat, poultry, or fish each day. A 3-oz (85-g) serving of meat is about the same size as a deck of cards. One egg equals 1 oz (28 g). ? 2-3 servings of low-fat dairy each day. One serving is 1 cup (237 mL). ? 1 serving of nuts, seeds, or beans 5 times each  week. ? 2-3 servings of heart-healthy fats. Healthy fats called omega-3 fatty acids are found in foods such as walnuts, flaxseeds, fortified milks, and eggs. These fats are also found in cold-water fish, such as sardines, salmon, and mackerel.  Limit how much you eat of: ? Canned or prepackaged foods. ? Food that is high in trans fat, such as some fried foods. ? Food that is high in saturated fat, such as fatty meat. ? Desserts and other sweets, sugary drinks, and other foods with added sugar. ? Full-fat dairy products.  Do not salt foods before eating.  Do not eat more than 4 egg yolks a week.  Try to eat at least 2 vegetarian meals a week.  Eat more home-cooked food and less restaurant, buffet, and fast food.   Lifestyle  When eating at a restaurant, ask that your food be prepared with less salt or no salt, if possible.  If you drink alcohol: ? Limit how much you use to:  0-1 drink a day for women who are not pregnant.  0-2 drinks a day for men. ? Be aware of how much alcohol is in your drink. In the [place] one drink equals one 12 oz bottle of beer (355 mL), one 5 oz glass of wine (148 mL), or one 1 oz glass of hard liquor (44 mL). General information  Avoid eating more than 2,300 mg of salt a day. If you have hypertension, you may need to reduce your sodium intake to 1,500 mg a day.  Work with your health care provider  to maintain a healthy body weight or to lose weight. Ask what an ideal weight is for you.  Get at least 30 minutes of exercise that causes your heart to beat faster (aerobic exercise) most days of the week. Activities may include walking, swimming, or biking.  Work with your health care provider or dietitian to adjust your eating plan to your individual calorie needs. What foods should I eat? Fruits All fresh, dried, or frozen fruit. Canned fruit in natural juice (without added sugar). Vegetables Fresh or frozen vegetables (raw, steamed, roasted, or  grilled). Low-sodium or reduced-sodium tomato and vegetable juice. Low-sodium or reduced-sodium tomato sauce and tomato paste. Low-sodium or reduced-sodium canned vegetables. Grains Whole-grain or whole-wheat bread. Whole-grain or whole-wheat pasta. Brown rice. [place]. Bulgur. Whole-grain and low-sodium cereals. Pita bread. Low-fat, low-sodium crackers. Whole-wheat flour tortillas. Meats and other proteins Skinless chicken or [place]. Ground chicken or [place]. Pork with fat trimmed off. Fish and seafood. Egg whites. Dried beans, peas, or lentils. Unsalted nuts, nut butters, and seeds. Unsalted canned beans. Lean cuts of beef with fat trimmed off. Low-sodium, lean precooked or cured meat, such as sausages or meat loaves. Dairy Low-fat (1%) or fat-free (skim) milk. Reduced-fat, low-fat, or fat-free cheeses. Nonfat, low-sodium ricotta or cottage cheese. Low-fat or nonfat yogurt. Low-fat, low-sodium cheese. Fats and oils Soft margarine without trans fats. Vegetable oil. Reduced-fat, low-fat, or light mayonnaise and salad dressings (reduced-sodium). Canola, safflower, olive, avocado, soybean, and sunflower oils. Avocado. Seasonings and condiments Herbs. Spices. Seasoning mixes without salt. Other foods Unsalted popcorn and pretzels. Fat-free sweets. The items listed above may not be a complete list of foods and beverages you can eat. Contact a dietitian for more information. What foods should I avoid? Fruits Canned fruit in a light or heavy syrup. Fried fruit. Fruit in cream or butter sauce. Vegetables Creamed or fried vegetables. Vegetables in a cheese sauce. Regular canned vegetables (not low-sodium or reduced-sodium). Regular canned tomato sauce and paste (not low-sodium or reduced-sodium). Regular tomato and vegetable juice (not low-sodium or reduced-sodium). Angie Fava. Olives. Grains Baked goods made with fat, such as croissants, muffins, or some breads. Dry pasta or rice meal packs. Meats  and other proteins Fatty cuts of meat. Ribs. Fried meat. Berniece Salines. Bologna, salami, and other precooked or cured meats, such as sausages or meat loaves. Fat from the back of a pig (fatback). Bratwurst. Salted nuts and seeds. Canned beans with added salt. Canned or smoked fish. Whole eggs or egg yolks. Chicken or [place] with skin. Dairy Whole or 2% milk, cream, and half-and-half. Whole or full-fat cream cheese. Whole-fat or sweetened yogurt. Full-fat cheese. Nondairy creamers. Whipped toppings. Processed cheese and cheese spreads. Fats and oils Butter. Stick margarine. Lard. Shortening. Ghee. Bacon fat. Tropical oils, such as coconut, palm kernel, or palm oil. Seasonings and condiments Onion salt, garlic salt, seasoned salt, table salt, and sea salt. Worcestershire sauce. Tartar sauce. Barbecue sauce. Teriyaki sauce. Soy sauce, including reduced-sodium. Steak sauce. Canned and packaged gravies. Fish sauce. Oyster sauce. Cocktail sauce. Store-bought horseradish. Ketchup. Mustard. Meat flavorings and tenderizers. Bouillon cubes. Hot sauces. Pre-made or packaged marinades. Pre-made or packaged taco seasonings. Relishes. Regular salad dressings. Other foods Salted popcorn and pretzels. The items listed above may not be a complete list of foods and beverages you should avoid. Contact a dietitian for more information. Where to find more information  National Heart, Lung, and Blood Institute: https://wilson-eaton.com/  American Heart Association: www.heart.org  Academy of Nutrition and Dietetics: www.eatright.[place]:  www.kidney.org Summary  The DASH eating plan is a healthy eating plan that has been shown to reduce high blood pressure (hypertension). It may also reduce your risk for type 2 diabetes, heart disease, and stroke.  When on the DASH eating plan, aim to eat more fresh fruits and vegetables, whole grains, lean proteins, low-fat dairy, and heart-healthy fats.  With the DASH  eating plan, you should limit salt (sodium) intake to 2,300 mg a day. If you have hypertension, you may need to reduce your sodium intake to 1,500 mg a day.  Work with your health care provider or dietitian to adjust your eating plan to your individual calorie needs. This information is not intended to replace advice given to you by your health care provider. Make sure you discuss any questions you have with your health care provider. Document Revised: 01/08/2019 Document Reviewed: 01/08/2019 Elsevier Patient Education  2021 [employer].

## 2020-05-31 LAB — CBC
Hematocrit: 38.8 % (ref 34.0–46.6)
Hemoglobin: 12.8 g/dL (ref 11.1–15.9)
MCH: 28.6 pg (ref 26.6–33.0)
MCHC: 33 g/dL (ref 31.5–35.7)
MCV: 87 fL (ref 79–97)
Platelets: 225 10*3/uL (ref 150–450)
RBC: 4.47 x10E6/uL (ref 3.77–5.28)
RDW: 13.1 % (ref 11.7–15.4)
WBC: 4 10*3/uL (ref 3.4–10.8)

## 2020-05-31 LAB — LIPID PANEL
Chol/HDL Ratio: 3.2 ratio (ref 0.0–4.4)
Cholesterol, Total: 146 mg/dL (ref 100–199)
HDL: 46 mg/dL (ref >39)
LDL Chol Calc (NIH): 90 mg/dL (ref 0–99)
Triglycerides: 46 mg/dL (ref 0–149)
VLDL Cholesterol Cal: 10 mg/dL (ref 5–40)

## 2020-05-31 LAB — VITAMIN D 25 HYDROXY (VIT D DEFICIENCY, FRACTURES): Vit D, 25-Hydroxy: 24.9 ng/mL — ABNORMAL LOW (ref 30.0–100.0)

## 2020-05-31 LAB — CMP14+EGFR
ALT: 63 IU/L — ABNORMAL HIGH (ref 0–32)
AST: 33 IU/L (ref 0–40)
Albumin/Globulin Ratio: 1.5 (ref 1.2–2.2)
Albumin: 4.1 g/dL (ref 3.8–4.8)
Alkaline Phosphatase: 57 IU/L (ref 44–121)
BUN/Creatinine Ratio: 10 (ref 9–23)
BUN: 9 mg/dL (ref 6–24)
Bilirubin Total: 0.4 mg/dL (ref 0.0–1.2)
CO2: 22 mmol/L (ref 20–29)
Calcium: 9.3 mg/dL (ref 8.7–10.2)
Chloride: 102 mmol/L (ref 96–106)
Creatinine, Ser: 0.94 mg/dL (ref 0.57–1.00)
Globulin, Total: 2.7 g/dL (ref 1.5–4.5)
Glucose: 84 mg/dL (ref 65–99)
Potassium: 3.9 mmol/L (ref 3.5–5.2)
Sodium: 139 mmol/L (ref 134–144)
Total Protein: 6.8 g/dL (ref 6.0–8.5)
eGFR: 77 mL/min/{1.73_m2} (ref >59)

## 2020-05-31 LAB — TSH: TSH: 2.41 u[IU]/mL (ref 0.450–4.500)

## 2020-05-31 LAB — HEMOGLOBIN A1C
Est. average glucose Bld gHb Est-mCnc: 108 mg/dL
Hgb A1c MFr Bld: 5.4 % (ref 4.8–5.6)

## 2020-06-06 ENCOUNTER — Ambulatory Visit: Payer: 59 | Admitting: Internal Medicine

## 2020-06-07 ENCOUNTER — Other Ambulatory Visit: Payer: Self-pay

## 2020-06-07 ENCOUNTER — Encounter: Payer: Self-pay | Admitting: Internal Medicine

## 2020-06-07 ENCOUNTER — Ambulatory Visit (INDEPENDENT_AMBULATORY_CARE_PROVIDER_SITE_OTHER): Payer: 59 | Admitting: Internal Medicine

## 2020-06-07 VITALS — BP 132/78 | HR 97 | Temp 98.6°F | Resp 18 | Ht 62.0 in | Wt 129.1 lb

## 2020-06-07 DIAGNOSIS — E559 Vitamin D deficiency, unspecified: Secondary | ICD-10-CM

## 2020-06-07 DIAGNOSIS — R030 Elevated blood-pressure reading, without diagnosis of hypertension: Secondary | ICD-10-CM

## 2020-06-07 NOTE — Patient Instructions (Signed)
Please continue to follow DASH diet and perform moderate exercise/walking at leat 150 mins/week.  Please start taking Vitamin D 2000 IU once a day.

## 2020-06-09 NOTE — Assessment & Plan Note (Addendum)
BP Readings from Last 1 Encounters:  06/07/20 132/78   Well-controlled with diet modification only DC Amlodipine Advised DASH diet and moderate exercise/walking, at least 150 mins/week

## 2020-06-09 NOTE — Progress Notes (Signed)
Established Patient Office Visit  Subjective:  Patient ID: Nichole Vargas, female    DOB: [birth year]  Age: 45 y.o. MRN: [id number]  CC:  Chief Complaint  Patient presents with  . Follow-up    6 week follow up     HPI Nichole Vargas  is a 45 year old female with PMH of fibroid s/p hysterectomy and anemia who presents for follow up of her BP.  Her BP was wnl today. She had stopped taking Amlodipine as she had been feeling fine without it. She prefers to avoid any medications. She agrees to follow DASH diet and continue physical activity/walking.  Blood tests were reviewed and discussed with the patient in detail.  Past Medical History:  Diagnosis Date  . Anemia 04/11/2016  . Contraceptive management 01/28/2013  . Fibroids 07/24/2016  . Hematuria 04/11/2016  . History of HPV infection 01/28/2013  . HPV (human papilloma virus) anogenital infection   . Vaginal itching 02/23/2015  . Yeast infection 02/23/2015    Past Surgical History:  Procedure Laterality Date  . ABDOMINAL HYSTERECTOMY N/A    Phreesia 04/25/2020  . BILATERAL SALPINGECTOMY Bilateral 09/11/2016   Procedure: BILATERAL SALPINGECTOMY;  Surgeon: Florian Buff, MD;  Location: [facility];  Service: Gynecology;  Laterality: Bilateral;  . SUPRACERVICAL ABDOMINAL HYSTERECTOMY N/A 09/11/2016   Procedure: HYSTERECTOMY SUPRACERVICAL ABDOMINAL;  Surgeon: Florian Buff, MD;  Location: [facility];  Service: Gynecology;  Laterality: N/A;  . TOOTH EXTRACTION      Family History  Problem Relation Age of Onset  . Hypertension Mother     Social History   Socioeconomic History  . Marital status: Single    Spouse name: Not on file  . Number of children: 2  . Years of education: Not on file  . Highest education level: Not on file  Occupational History  . Not on file  Tobacco Use  . Smoking status: Never Smoker  . Smokeless tobacco: Never Used  Vaping Use  . Vaping Use: Never used  Substance and Sexual Activity  . Alcohol use: No   . Drug use: No  . Sexual activity: Yes    Birth control/protection: Surgical    Comment: supracervical hyst  Other Topics Concern  . Not on file  Social History Narrative  . Not on file   Social Determinants of Health   Financial Resource Strain: Low Risk   . Difficulty of Paying Living Expenses: Not hard at all  Food Insecurity: No Food Insecurity  . Worried About Charity fundraiser in the Last Year: Never true  . Ran Out of Food in the Last Year: Never true  Transportation Needs: No Transportation Needs  . Lack of Transportation (Medical): No  . Lack of Transportation (Non-Medical): No  Physical Activity: Insufficiently Active  . Days of Exercise per Week: 2 days  . Minutes of Exercise per Session: 10 min  Stress: No Stress Concern Present  . Feeling of Stress : Only a little  Social Connections: Moderately Isolated  . Frequency of Communication with Friends and Family: More than three times a week  . Frequency of Social Gatherings with Friends and Family: More than three times a week  . Attends Religious Services: More than 4 times per year  . Active Member of Clubs or Organizations: No  . Attends Archivist Meetings: Never  . Marital Status: Never married  Intimate Partner Violence: Not At Risk  . Fear of Current or Ex-Partner: No  . Emotionally Abused: No  .  Physically Abused: No  . Sexually Abused: No    Outpatient Medications Prior to Visit  Medication Sig Dispense Refill  . amLODipine (NORVASC) 5 MG tablet Take 1 tablet (5 mg total) by mouth daily. (Patient not taking: Reported on 06/07/2020) 30 tablet 2   No facility-administered medications prior to visit.    No Known Allergies  ROS Review of Systems  Constitutional: Negative for chills and fever.  HENT: Negative for congestion, sinus pressure, sinus pain and sore throat.   Eyes: Negative for pain and discharge.  Respiratory: Negative for cough and shortness of breath.   Cardiovascular:  Negative for chest pain and palpitations.  Gastrointestinal: Negative for abdominal pain, constipation, diarrhea, nausea and vomiting.  Endocrine: Negative for polydipsia and polyuria.  Genitourinary: Negative for dysuria and hematuria.  Musculoskeletal: Negative for neck pain and neck stiffness.  Skin: Negative for rash.  Neurological: Negative for dizziness and weakness.  Psychiatric/Behavioral: Negative for agitation and behavioral problems.      Objective:    Physical Exam Vitals reviewed.  Constitutional:      General: She is not in acute distress.    Appearance: She is not diaphoretic.  HENT:     Head: Normocephalic and atraumatic.     Nose: Nose normal. No congestion.     Mouth/Throat:     Mouth: Mucous membranes are moist.     Pharynx: No posterior oropharyngeal erythema.  Eyes:     General: No scleral icterus.    Extraocular Movements: Extraocular movements intact.     Pupils: Pupils are equal, round, and reactive to light.  Cardiovascular:     Rate and Rhythm: Normal rate and regular rhythm.     Pulses: Normal pulses.     Heart sounds: Normal heart sounds. No murmur heard.   Pulmonary:     Breath sounds: Normal breath sounds. No wheezing or rales.  Musculoskeletal:     Cervical back: Neck supple. No tenderness.     Right lower leg: No edema.     Left lower leg: No edema.  Skin:    General: Skin is warm.     Findings: No rash.  Neurological:     General: No focal deficit present.     Mental Status: She is alert and oriented to person, place, and time.  Psychiatric:        Mood and Affect: Mood normal.        Behavior: Behavior normal.     BP 132/78 (BP Location: Left Arm, Cuff Size: Normal)   Pulse 97   Temp 98.6 F (37 C) (Oral)   Resp 18   Ht 5\' 2"  (1.575 m)   Wt 129 lb 1.9 oz (58.6 kg)   LMP 08/16/2016 Comment: [facility] 7/18  SpO2 99%   BMI 23.62 kg/m  Wt Readings from Last 3 Encounters:  06/07/20 129 lb 1.9 oz (58.6 kg)  04/25/20 129 lb 6.4 oz  (58.7 kg)  03/21/20 129 lb (58.5 kg)     Health Maintenance Due  Topic Date Due  . COVID-19 Vaccine (3 - Booster for Moderna series) 02/25/2020    There are no preventive care reminders to display for this patient.  Lab Results  Component Value Date   TSH 2.410 05/30/2020   Lab Results  Component Value Date   WBC 4.0 05/30/2020   HGB 12.8 05/30/2020   HCT 38.8 05/30/2020   MCV 87 05/30/2020   PLT 225 05/30/2020   Lab Results  Component Value Date  NA 139 05/30/2020   K 3.9 05/30/2020   CO2 22 05/30/2020   GLUCOSE 84 05/30/2020   BUN 9 05/30/2020   CREATININE 0.94 05/30/2020   BILITOT 0.4 05/30/2020   ALKPHOS 57 05/30/2020   AST 33 05/30/2020   ALT 63 (H) 05/30/2020   PROT 6.8 05/30/2020   ALBUMIN 4.1 05/30/2020   CALCIUM 9.3 05/30/2020   ANIONGAP 7 09/12/2016   EGFR 77 05/30/2020   Lab Results  Component Value Date   CHOL 146 05/30/2020   Lab Results  Component Value Date   HDL 46 05/30/2020   Lab Results  Component Value Date   LDLCALC 90 05/30/2020   Lab Results  Component Value Date   TRIG 46 05/30/2020   Lab Results  Component Value Date   CHOLHDL 3.2 05/30/2020   Lab Results  Component Value Date   HGBA1C 5.4 05/30/2020      Assessment & Plan:   Problem List Items Addressed This Visit      Other   Prehypertension - Primary    BP Readings from Last 1 Encounters:  06/07/20 132/78   Well-controlled with diet modification only DC Amlodipine Advised DASH diet and moderate exercise/walking, at least 150 mins/week       Vitamin D deficiency    Last vitamin D Lab Results  Component Value Date   VD25OH 24.9 (L) 05/30/2020   Advised to take Vitamin D 2000 IU QD         No orders of the defined types were placed in this encounter.   Follow-up: Return in about 6 months (around 12/07/2020) for Annual physical.    Lindell Spar, MD

## 2020-06-09 NOTE — Assessment & Plan Note (Signed)
Last vitamin D Lab Results  Component Value Date   VD25OH 24.9 (L) 05/30/2020   Advised to take Vitamin D 2000 IU QD

## 2020-12-08 ENCOUNTER — Encounter: Payer: 59 | Admitting: Internal Medicine

## 2021-03-09 ENCOUNTER — Other Ambulatory Visit (HOSPITAL_COMMUNITY): Payer: Self-pay | Admitting: Adult Health

## 2021-03-09 DIAGNOSIS — Z1231 Encounter for screening mammogram for malignant neoplasm of breast: Secondary | ICD-10-CM

## 2021-03-30 ENCOUNTER — Other Ambulatory Visit: Payer: Self-pay

## 2021-03-30 ENCOUNTER — Ambulatory Visit (INDEPENDENT_AMBULATORY_CARE_PROVIDER_SITE_OTHER): Payer: 59 | Admitting: Adult Health

## 2021-03-30 ENCOUNTER — Encounter: Payer: Self-pay | Admitting: Adult Health

## 2021-03-30 VITALS — BP 122/77 | HR 79 | Ht 62.5 in | Wt 131.0 lb

## 2021-03-30 DIAGNOSIS — Z01419 Encounter for gynecological examination (general) (routine) without abnormal findings: Secondary | ICD-10-CM | POA: Diagnosis not present

## 2021-03-30 DIAGNOSIS — Z1211 Encounter for screening for malignant neoplasm of colon: Secondary | ICD-10-CM

## 2021-03-30 DIAGNOSIS — Z90711 Acquired absence of uterus with remaining cervical stump: Secondary | ICD-10-CM

## 2021-03-30 DIAGNOSIS — Z1212 Encounter for screening for malignant neoplasm of rectum: Secondary | ICD-10-CM

## 2021-03-30 NOTE — Progress Notes (Signed)
Patient ID: Nichole Vargas, female   DOB: 1975/09/29, 46 y.o.   MRN: [id number] History of Present Illness: Nichole Vargas is a 46 year old black female,single, sp [facility] in for well woman gyn exam . PCP is Dr Posey Pronto   Current Medications, Allergies, Past Medical History, Past Surgical History, Family History and Social History were reviewed in [place] record.     Review of Systems:  Patient denies any headaches, hearing loss, fatigue, blurred vision, shortness of breath, chest pain, abdominal pain, problems with bowel movements, urination, or intercourse. No joint pain or mood swings.  She still spots some most months   Physical Exam:BP 122/77 (BP Location: Right Arm, Patient Position: Sitting, Cuff Size: Normal)    Pulse 79    Ht 5' 2.5" (1.588 m)    Wt 131 lb (59.4 kg)    LMP 08/16/2016 Comment: [facility] 7/18   BMI 23.58 kg/m   General:  Well developed, well nourished, no acute distress Skin:  Warm and dry Neck:  Midline trachea, normal thyroid, good ROM, no lymphadenopathy Lungs; Clear to auscultation bilaterally Breast:  No dominant palpable mass, retraction, or nipple discharge Cardiovascular: Regular rate and rhythm Abdomen:  Soft, non tender, no hepatosplenomegaly Pelvic:  External genitalia is normal in appearance, no lesions.  The vagina is normal in appearance. Urethra has no lesions or masses. The cervix is smooth, scan brown blood at os.  Uterus is absent.  No adnexal masses or tenderness noted.Bladder is non tender, no masses felt. Rectal: Good sphincter tone, no polyps, or hemorrhoids felt.  Hemoccult negative. Extremities/musculoskeletal:  No swelling or varicosities noted, no clubbing or cyanosis Psych:  No mood changes, alert and cooperative,seems happy AA is 0 Fall risk is low Depression screen [facility] 2/9 03/30/2021 06/07/2020 04/25/2020  Decreased Interest 0 0 0  Down, Depressed, Hopeless 0 0 0  PHQ - 2 Score 0 0 0  Altered sleeping 0 - -  Tired, decreased  energy 0 - -  Change in appetite 0 - -  Feeling bad or failure about yourself  0 - -  Trouble concentrating 0 - -  Moving slowly or fidgety/restless 0 - -  Suicidal thoughts 0 - -  PHQ-9 Score 0 - -    GAD 7 : Generalized Anxiety Score 03/30/2021 03/21/2020  Nervous, Anxious, on Edge 0 0  Control/stop worrying 0 0  Worry too much - different things 0 0  Trouble relaxing 0 0  Restless 0 0  Easily annoyed or irritable 0 0  Afraid - awful might happen 0 0  Total GAD 7 Score 0 0      Upstream - 03/30/21 0834       Pregnancy Intention Screening   Does the patient want to become pregnant in the next year? No    Does the patient's partner want to become pregnant in the next year? No    Would the patient like to discuss contraceptive options today? No      Contraception Wrap Up   Current Method Female Sterilization   hyst   End Method Female Sterilization   hyst   Contraception Counseling Provided No            Examination chaperoned by Celene Squibb LPN  Impression and Plan: 1. Encounter for well woman exam with routine gynecological exam Physical in 1 year Pap in 2025 Mammogram 04/09/21 Labs with PCP  2. S/P abdominal supracervical subtotal hysterectomy Pap 2025 Still spots some  3. Encounter for screening  fecal occult blood testing  4. Screening for colorectal cancer Referred for screening colonoscopy  - Ambulatory referral to Gastroenterology

## 2021-04-02 ENCOUNTER — Encounter (INDEPENDENT_AMBULATORY_CARE_PROVIDER_SITE_OTHER): Payer: Self-pay | Admitting: *Deleted

## 2021-04-09 ENCOUNTER — Ambulatory Visit (HOSPITAL_COMMUNITY)
Admission: RE | Admit: 2021-04-09 | Discharge: 2021-04-09 | Disposition: A | Discharge status: Home / Self Care | Payer: 59 | Source: Ambulatory Visit | Attending: Adult Health | Admitting: Adult Health

## 2021-04-09 ENCOUNTER — Other Ambulatory Visit: Payer: Self-pay

## 2021-04-09 DIAGNOSIS — Z1231 Encounter for screening mammogram for malignant neoplasm of breast: Secondary | ICD-10-CM | POA: Insufficient documentation

## 2022-03-11 ENCOUNTER — Other Ambulatory Visit (HOSPITAL_COMMUNITY): Payer: Self-pay | Admitting: Adult Health

## 2022-03-11 DIAGNOSIS — Z1231 Encounter for screening mammogram for malignant neoplasm of breast: Secondary | ICD-10-CM

## 2022-04-12 ENCOUNTER — Ambulatory Visit (HOSPITAL_COMMUNITY)
Admission: RE | Admit: 2022-04-12 | Discharge: 2022-04-12 | Disposition: A | Discharge status: Home / Self Care | Payer: 59 | Source: Ambulatory Visit | Attending: Adult Health | Admitting: Adult Health

## 2022-04-12 DIAGNOSIS — Z1231 Encounter for screening mammogram for malignant neoplasm of breast: Secondary | ICD-10-CM | POA: Insufficient documentation

## 2022-04-23 ENCOUNTER — Ambulatory Visit (INDEPENDENT_AMBULATORY_CARE_PROVIDER_SITE_OTHER): Payer: 59 | Admitting: Adult Health

## 2022-04-23 ENCOUNTER — Encounter: Payer: Self-pay | Admitting: Adult Health

## 2022-04-23 VITALS — BP 120/58 | HR 59 | Ht 62.0 in | Wt 135.5 lb

## 2022-04-23 DIAGNOSIS — Z01419 Encounter for gynecological examination (general) (routine) without abnormal findings: Secondary | ICD-10-CM

## 2022-04-23 DIAGNOSIS — Z1211 Encounter for screening for malignant neoplasm of colon: Secondary | ICD-10-CM

## 2022-04-23 DIAGNOSIS — Z1339 Encounter for screening examination for other mental health and behavioral disorders: Secondary | ICD-10-CM

## 2022-04-23 DIAGNOSIS — Z90711 Acquired absence of uterus with remaining cervical stump: Secondary | ICD-10-CM | POA: Diagnosis not present

## 2022-04-23 LAB — HEMOCCULT GUIAC POC 1CARD (OFFICE): Fecal Occult Blood, POC: NEGATIVE

## 2022-04-23 NOTE — Progress Notes (Signed)
Patient ID: Nichole Vargas, female   DOB: 12-16-1975, 47 y.o.   MRN: [id number] History of Present Illness: Nichole Vargas is a 47 year old black female, single, Sp [facility] in for a well woman gyn exam. She has some spotting most months, when time for a period.  Last pap was negative HPV,NILM 03/21/20.  PCP is Dr Posey Pronto.    Current Medications, Allergies, Past Medical History, Past Surgical History, Family History and Social History were reviewed in [employer] record.     Review of Systems: Patient denies any headaches, hearing loss, fatigue, blurred vision, shortness of breath, chest pain, abdominal pain, problems with bowel movements, urination, or intercourse. No joint pain or mood swings.  Has spotting most months when time for a period   Physical Exam:BP (!) 120/58 (BP Location: Left Arm, Patient Position: Sitting, Cuff Size: Normal)   Pulse (!) 59   Ht '5\' 2"'$  (1.575 m)   Wt 135 lb 8 oz (61.5 kg)   LMP 08/16/2016 Comment: [facility] 7/18  BMI 24.78 kg/m   General:  Well developed, well nourished, no acute distress Skin:  Warm and dry Neck:  Midline trachea, normal thyroid, good ROM, no lymphadenopathy Lungs; Clear to auscultation bilaterally Breast:  No dominant palpable mass, retraction, or nipple discharge Cardiovascular: Regular rate and rhythm Abdomen:  Soft, non tender, no hepatosplenomegaly Pelvic:  External genitalia is normal in appearance, no lesions.  The vagina is normal in appearance. Urethra has no lesions or masses. The cervix is smooth, uterus is absent.  No adnexal masses or tenderness noted.Bladder is non tender, no masses felt. Rectal: Good sphincter tone, no polyps, or hemorrhoids felt.  Hemoccult negative. Extremities/musculoskeletal:  No swelling or varicosities noted, no clubbing or cyanosis Psych:  No mood changes, alert and cooperative,seems happy AA is 0 Fall risk is low    04/23/2022    3:19 PM 03/30/2021    8:34 AM 06/07/2020    8:05 AM   Depression screen PHQ 2/9  Decreased Interest 0 0 0  Down, Depressed, Hopeless 0 0 0  PHQ - 2 Score 0 0 0  Altered sleeping 0 0   Tired, decreased energy 0 0   Change in appetite 0 0   Feeling bad or failure about yourself  0 0   Trouble concentrating 0 0   Moving slowly or fidgety/restless 0 0   Suicidal thoughts 0 0   PHQ-9 Score 0 0        04/23/2022    3:19 PM 03/30/2021    8:35 AM 03/21/2020    8:41 AM  GAD 7 : Generalized Anxiety Score  Nervous, Anxious, on Edge 0 0 0  Control/stop worrying 0 0 0  Worry too much - different things 0 0 0  Trouble relaxing 0 0 0  Restless 0 0 0  Easily annoyed or irritable 0 0 0  Afraid - awful might happen 0 0 0  Total GAD 7 Score 0 0 0    Upstream - 04/23/22 1525       Pregnancy Intention Screening   Does the patient want to become pregnant in the next year? N/A    Does the patient's partner want to become pregnant in the next year? N/A    Would the patient like to discuss contraceptive options today? N/A      Contraception Wrap Up   Current Method Female Sterilization   [facility]   End Method Female Sterilization   [facility]   Contraception Counseling Provided  No              Examination chaperoned by Levy Pupa LPN   Impression and Plan: 1. Encounter for well woman exam with routine gynecological exam Pap and physical in 1 year Negative mammogram 04/12/22 Check insurance on colonoscopy   2. S/P abdominal supracervical subtotal hysterectomy Spots most months when time for a period  3. Encounter for screening fecal occult blood testing Hemoccult was negative

## 2023-02-25 ENCOUNTER — Other Ambulatory Visit (HOSPITAL_COMMUNITY): Payer: Self-pay | Admitting: Adult Health

## 2023-02-25 DIAGNOSIS — Z1231 Encounter for screening mammogram for malignant neoplasm of breast: Secondary | ICD-10-CM

## 2023-04-16 ENCOUNTER — Ambulatory Visit (HOSPITAL_COMMUNITY)
Admission: RE | Admit: 2023-04-16 | Discharge: 2023-04-16 | Disposition: A | Discharge status: Home / Self Care | Payer: 59 | Source: Ambulatory Visit | Attending: Adult Health | Admitting: Adult Health

## 2023-04-16 DIAGNOSIS — Z1231 Encounter for screening mammogram for malignant neoplasm of breast: Secondary | ICD-10-CM | POA: Insufficient documentation

## 2023-04-25 ENCOUNTER — Ambulatory Visit: Payer: 59 | Admitting: Adult Health

## 2023-04-25 ENCOUNTER — Encounter: Payer: Self-pay | Admitting: Adult Health

## 2023-04-25 ENCOUNTER — Other Ambulatory Visit (HOSPITAL_COMMUNITY)
Admission: RE | Admit: 2023-04-25 | Discharge: 2023-04-25 | Disposition: A | Discharge status: Home / Self Care | Source: Ambulatory Visit | Attending: Adult Health | Admitting: Adult Health

## 2023-04-25 VITALS — BP 138/77 | HR 80 | Ht 61.0 in | Wt 134.5 lb

## 2023-04-25 DIAGNOSIS — Z1212 Encounter for screening for malignant neoplasm of rectum: Secondary | ICD-10-CM | POA: Diagnosis not present

## 2023-04-25 DIAGNOSIS — Z1211 Encounter for screening for malignant neoplasm of colon: Secondary | ICD-10-CM

## 2023-04-25 DIAGNOSIS — Z01419 Encounter for gynecological examination (general) (routine) without abnormal findings: Secondary | ICD-10-CM | POA: Insufficient documentation

## 2023-04-25 DIAGNOSIS — Z90711 Acquired absence of uterus with remaining cervical stump: Secondary | ICD-10-CM

## 2023-04-25 NOTE — Progress Notes (Addendum)
 Patient ID: Nichole Vargas, female   DOB: [birth year], 48 y.o.   MRN: [id number] History of Present Illness: Nichole Vargas is a 48 year old black female,single, sp [facility], in for a well woman gyn exam and pap.  PCP is Dr Allena Katz    Current Medications, Allergies, Past Medical History, Past Surgical History, Family History and Social History were reviewed in [employer] electronic medical record.     Review of Systems: Patient denies any headaches, hearing loss, fatigue, blurred vision, shortness of breath, chest pain, abdominal pain, problems with bowel movements, urination, or intercourse. No joint pain or mood swings.  Spots brown monthly   Physical Exam:BP 138/77 (BP Location: Left Arm, Patient Position: Sitting, Cuff Size: Normal)   Pulse 80   Ht 5\' 1"  (1.549 m)   Wt 134 lb 8 oz (61 kg)   LMP 08/16/2016 Comment: [facility] 7/18  BMI 25.41 kg/m   General:  Well developed, well nourished, no acute distress Skin:  Warm and dry Neck:  Midline trachea, normal thyroid, good ROM, no lymphadenopathy Lungs; Clear to auscultation bilaterally Breast:  No dominant palpable mass, retraction, or nipple discharge Cardiovascular: Regular rate and rhythm Abdomen:  Soft, non tender, no hepatosplenomegaly Pelvic:  External genitalia is normal in appearance, no lesions.  The vagina is normal in appearance,brown discharge. Urethra has no lesions or masses. The cervix is smooth.Pap with HR HPV genotyping performed.   Uterus is absent.  No adnexal masses or tenderness noted.Bladder is non tender, no masses felt. Rectal: deferred Extremities/musculoskeletal:  No swelling or varicosities noted, no clubbing or cyanosis Psych:  No mood changes, alert and cooperative,seems happy AA is 0 Fall risk is low    04/25/2023    8:38 AM 04/23/2022    3:19 PM 03/30/2021    8:34 AM  Depression screen PHQ 2/9  Decreased Interest 0 0 0  Down, Depressed, Hopeless 0 0 0  PHQ - 2 Score 0 0 0  Altered sleeping 0 0 0  Tired,  decreased energy 0 0 0  Change in appetite 0 0 0  Feeling bad or failure about yourself  0 0 0  Trouble concentrating 0 0 0  Moving slowly or fidgety/restless 0 0 0  Suicidal thoughts 0 0 0  PHQ-9 Score 0 0 0       04/25/2023    8:41 AM 04/23/2022    3:19 PM 03/30/2021    8:35 AM 03/21/2020    8:41 AM  GAD 7 : Generalized Anxiety Score  Nervous, Anxious, on Edge 0 0 0 0  Control/stop worrying 0 0 0 0  Worry too much - different things 0 0 0 0  Trouble relaxing 0 0 0 0  Restless 0 0 0 0  Easily annoyed or irritable 0 0 0 0  Afraid - awful might happen 0 0 0 0  Total GAD 7 Score 0 0 0 0      Upstream - 04/25/23 0837       Pregnancy Intention Screening   Does the patient want to become pregnant in the next year? N/A    Does the patient's partner want to become pregnant in the next year? N/A    Would the patient like to discuss contraceptive options today? N/A      Contraception Wrap Up   Current Method Female Sterilization   [facility]   End Method Female Sterilization   [facility]   Contraception Counseling Provided No  Examination chaperoned by Malachy Mood LPN   Impression and Plan: 1. Encounter for gynecological examination with Papanicolaou smear of cervix (Primary) Physical in 1 year Pap sent Pap in 3 years if normal Mammogram was negative 04/15/23 Stay active Labs with PCP   2. S/P abdominal supracervical subtotal hysterectomy Will spot brown monthly   3. Screening for colorectal cancer Ordered cologuard - Cologuard

## 2023-04-25 NOTE — Addendum Note (Signed)
 Addended by: Colen Darling on: 04/25/2023 09:05 AM   Modules accepted: Orders

## 2023-05-02 LAB — CYTOLOGY - PAP
Comment: NEGATIVE
Diagnosis: NEGATIVE
High risk HPV: NEGATIVE

## 2023-05-10 LAB — COLOGUARD: COLOGUARD: NEGATIVE

## 2024-03-05 ENCOUNTER — Other Ambulatory Visit (HOSPITAL_COMMUNITY): Payer: Self-pay | Admitting: Adult Health

## 2024-03-05 DIAGNOSIS — Z1231 Encounter for screening mammogram for malignant neoplasm of breast: Secondary | ICD-10-CM

## 2024-04-16 ENCOUNTER — Ambulatory Visit (HOSPITAL_COMMUNITY)

## 2024-04-26 ENCOUNTER — Ambulatory Visit: Admitting: Adult Health
